# Patient Record
Sex: Female | Born: 1940 | Race: White | Smoking: Current every day smoker
Health system: Southern US, Community
[De-identification: ages and names within clinical notes are randomized; demographics above are authoritative.]

## PROBLEM LIST (undated history)

## (undated) DIAGNOSIS — G43909 Migraine, unspecified, not intractable, without status migrainosus: Secondary | ICD-10-CM

## (undated) HISTORY — PX: BACK SURGERY: SHX140

## (undated) HISTORY — DX: Migraine, unspecified, not intractable, without status migrainosus: G43.909

## (undated) HISTORY — PX: ABDOMINAL HYSTERECTOMY: SHX81

---

## 2012-05-30 HISTORY — PX: CATARACT EXTRACTION, BILATERAL: SHX1313

## 2013-09-11 DIAGNOSIS — E782 Mixed hyperlipidemia: Secondary | ICD-10-CM | POA: Diagnosis not present

## 2013-09-27 DIAGNOSIS — Z0000 Encounter for general adult medical examination without abnormal findings: Secondary | ICD-10-CM | POA: Diagnosis not present

## 2013-09-27 DIAGNOSIS — Z23 Encounter for immunization: Secondary | ICD-10-CM | POA: Diagnosis not present

## 2014-07-04 DIAGNOSIS — L309 Dermatitis, unspecified: Secondary | ICD-10-CM | POA: Diagnosis not present

## 2014-08-14 DIAGNOSIS — L259 Unspecified contact dermatitis, unspecified cause: Secondary | ICD-10-CM | POA: Diagnosis not present

## 2014-08-14 DIAGNOSIS — L309 Dermatitis, unspecified: Secondary | ICD-10-CM | POA: Diagnosis not present

## 2014-08-20 DIAGNOSIS — L309 Dermatitis, unspecified: Secondary | ICD-10-CM | POA: Diagnosis not present

## 2014-10-22 DIAGNOSIS — L309 Dermatitis, unspecified: Secondary | ICD-10-CM | POA: Diagnosis not present

## 2015-01-13 ENCOUNTER — Ambulatory Visit (INDEPENDENT_AMBULATORY_CARE_PROVIDER_SITE_OTHER): Payer: Medicare Other | Admitting: Primary Care

## 2015-01-13 ENCOUNTER — Encounter: Payer: Self-pay | Admitting: Primary Care

## 2015-01-13 VITALS — BP 116/62 | HR 97 | Temp 98.1°F | Ht 59.0 in | Wt 109.0 lb

## 2015-01-13 DIAGNOSIS — E785 Hyperlipidemia, unspecified: Secondary | ICD-10-CM | POA: Diagnosis not present

## 2015-01-13 NOTE — Assessment & Plan Note (Signed)
Present for years, FH of as well. Endorses healthy lifestyle with diet and exercise. Last lipid panel completed in 08/2013: TC: 241, HDL: 89, LDL: 138, Trigs: 72. Dose not currently take daily aspirin or medication. Start aspirin 81 mg daily today. Will recheck lipids at upcoming physical.

## 2015-01-13 NOTE — Progress Notes (Signed)
Pre visit review using our clinic review tool, if applicable. No additional management support is needed unless otherwise documented below in the visit note. 

## 2015-01-13 NOTE — Progress Notes (Signed)
Subjective:    Patient ID: Janet Burgess, female    DOB: [birth year], 74 y.o.   MRN: [id number]  HPI  Janet Burgess is a 74 year old female who presents today to establish care and discuss the problems mentioned below. Will obtain old records.  1) Migraines: Intermittently for the past 40 years. She will typically wake up with her migraines, and migraines typically come with alteration of her sleep patterns. Her migraines are present to bilateral temporal region and will be present with photophobia, phonophobia, and nausea. Last migraine was over 1 year ago.  2) Hyperlipidemia: Present for many years, familial hyperlipidemia as she endorses healthy lifestyle. Labs from last April 2015: Total cholesterol: 241, HDL: 89, LDL: 138, Trigs: 72. Does not currently take medication or daily aspirin.  Diet typically consists of: Breakfast: English muffin with peanut butter,  Lunch: Apple, sandwich, vegetables, chicken, fish Dinner: Vegetables, cereal and toast, breakfast foods.  Limited meat eater. Occasional desserts. Beverages: Drinks mostly water, crystal light, diet coke, juice  Exercise: She does not currently exercise, but is active in her daily life.  Review of Systems  Constitutional: Negative for unexpected weight change.  HENT: Negative for rhinorrhea.   Respiratory: Negative for cough and shortness of breath.   Cardiovascular: Negative for chest pain.  Gastrointestinal: Negative for diarrhea, constipation and blood in stool.  Genitourinary: Negative for difficulty urinating.  Musculoskeletal: Negative for myalgias and arthralgias.  Skin: Negative for rash.  Neurological: Negative for dizziness, numbness and headaches.       Motion sickness, occasionally  Psychiatric/Behavioral:       Denies concerns for anxiety or depression       Past Medical History  Diagnosis Date  . Migraine     Social History   Social History  . Marital Status: Widowed    Spouse Name: N/A  .  Number of Children: N/A  . Years of Education: N/A   Occupational History  . Not on file.   Social History Main Topics  . Smoking status: Current Every Day Smoker    Types: Cigarettes  . Smokeless tobacco: Not on file     Comment: 8 per day  . Alcohol Use: 0.0 oz/week    0 Standard drinks or equivalent per week     Comment: 2 to 3 a week  . Drug Use: Not on file  . Sexual Activity: Not on file   Other Topics Concern  . Not on file   Social History Narrative   Widowed.    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   Enjoys gardening, stained glass, wood working, spending time on her farm, traveling.       Past Surgical History  Procedure Laterality Date  . Abdominal hysterectomy  [phone]  . Back surgery  1982-1989    4 times  . Cataract extraction, bilateral Bilateral 2014    Family History  Problem Relation Age of Onset  . Cancer Mother     breast    No Known Allergies  No current outpatient prescriptions on file prior to visit.   No current facility-administered medications on file prior to visit.    BP 116/62 mmHg  Pulse 97  Temp(Src) 98.1 F (36.7 C) (Oral)  Ht 4\' 11"  (1.499 m)  Wt 109 lb (49.442 kg)  BMI 22.00 kg/m2  SpO2 95%    Objective:   Physical Exam  Constitutional: She appears well-nourished.  HENT:  Right Ear: Tympanic  membrane and ear canal normal.  Left Ear: Tympanic membrane and ear canal normal.  Cardiovascular: Normal rate and regular rhythm.   Pulmonary/Chest: Effort normal and breath sounds normal.  Skin: Skin is warm and dry.  Psychiatric: She has a normal mood and affect.          Assessment & Plan:

## 2015-01-13 NOTE — Patient Instructions (Signed)
Please schedule a physical with me in the next 3 months. You will also schedule a lab only appointment one week prior. We will discuss your lab results during your physical.  Start taking daily aspirin 81 mg.  It was a pleasure to meet you today! Please don't hesitate to call me with any questions. Welcome to [facility]!

## 2015-01-14 ENCOUNTER — Other Ambulatory Visit: Payer: Self-pay | Admitting: Primary Care

## 2015-01-14 DIAGNOSIS — Z1231 Encounter for screening mammogram for malignant neoplasm of breast: Secondary | ICD-10-CM

## 2015-02-16 ENCOUNTER — Ambulatory Visit: Payer: Federal, State, Local not specified - PPO

## 2015-03-03 ENCOUNTER — Ambulatory Visit (INDEPENDENT_AMBULATORY_CARE_PROVIDER_SITE_OTHER): Payer: Medicare Other | Admitting: Primary Care

## 2015-03-03 ENCOUNTER — Encounter: Payer: Self-pay | Admitting: Primary Care

## 2015-03-03 VITALS — BP 120/72 | HR 71 | Temp 98.0°F | Ht 59.0 in | Wt 108.1 lb

## 2015-03-03 DIAGNOSIS — Z23 Encounter for immunization: Secondary | ICD-10-CM | POA: Diagnosis not present

## 2015-03-03 DIAGNOSIS — L03116 Cellulitis of left lower limb: Secondary | ICD-10-CM | POA: Diagnosis not present

## 2015-03-03 MED ORDER — DOXYCYCLINE HYCLATE 100 MG PO TABS: 100.0000 mg | ORAL_TABLET | Freq: Two times a day (BID) | ORAL | Status: DC

## 2015-03-03 NOTE — Progress Notes (Signed)
   Subjective:    Patient ID: Janet Burgess, female    DOB: 12-04-1940, 74 y.o.   MRN: [id number]  HPI  Janet Burgess is a 74 year old female who presents today with a chief complaint of wound. Her wound has been present for the past 10 days and is located to the left medial portion of her lower extremity near her calf. She found a bamboo splinter in her skin that had been present for about 15 days. She was able to remove it several days later. Overall she's had little to no improvement to her wound and the pain has become worse and is now spreading up her leg. She's applied bacitracin, neosporin, warm compresses without improvement.   Review of Systems  Constitutional: Negative for fever and chills.  Respiratory: Negative for shortness of breath.   Cardiovascular: Negative for chest pain.  Skin: Positive for wound.  Neurological: Negative for dizziness.       Past Medical History  Diagnosis Date  . Migraine     Social History   Social History  . Marital Status: Widowed    Spouse Name: N/A  . Number of Children: N/A  . Years of Education: N/A   Occupational History  . Not on file.   Social History Main Topics  . Smoking status: Current Every Day Smoker    Types: Cigarettes  . Smokeless tobacco: Not on file     Comment: 8 per day  . Alcohol Use: 0.0 oz/week    0 Standard drinks or equivalent per week     Comment: 2 to 3 a week  . Drug Use: Not on file  . Sexual Activity: Not on file   Other Topics Concern  . Not on file   Social History Narrative   Widowed.    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   Enjoys gardening, stained glass, wood working, spending time on her farm, traveling.       Past Surgical History  Procedure Laterality Date  . Abdominal hysterectomy  [phone]  . Back surgery  1982-1989    4 times  . Cataract extraction, bilateral Bilateral 2014    Family History  Problem Relation Age of Onset  .  Cancer Mother     breast    No Known Allergies  No current outpatient prescriptions on file prior to visit.   No current facility-administered medications on file prior to visit.    BP 120/72 mmHg  Pulse 71  Temp(Src) 98 F (36.7 C) (Oral)  Ht 4\' 11"  (1.499 m)  Wt 108 lb 1.9 oz (49.043 kg)  BMI 21.83 kg/m2  SpO2 94%    Objective:   Physical Exam  Constitutional: She appears well-nourished.  Cardiovascular: Normal rate and regular rhythm.   Pulmonary/Chest: Effort normal and breath sounds normal.  Skin: Skin is warm.  1/2 cm raised wound with erythema surrounding. Appears infectious. No drainage.          Assessment & Plan:  Cellulitis:  Present to left lower extremity, medial side near calf. Found splinter in skin about 10 days ago. No improvement since removal despite OTC treatment. Appears infectious with erythema and tenderness. RX for Doxycycline BID x 10 days. Keep wound clean and dry. Follow up PRN.

## 2015-03-03 NOTE — Patient Instructions (Signed)
Start Doxycycline antibiotics for skin infection. Take 1 tablet by mouth twice daily for 10 days.   Continue to keep wound clean and dry.  Please notify me if no improvement in the next 5 days.  It was a pleasure to see you today!

## 2015-03-03 NOTE — Progress Notes (Signed)
Pre visit review using our clinic review tool, if applicable. No additional management support is needed unless otherwise documented below in the visit note. 

## 2015-04-19 ENCOUNTER — Other Ambulatory Visit: Payer: Self-pay | Admitting: Primary Care

## 2015-04-19 DIAGNOSIS — E785 Hyperlipidemia, unspecified: Secondary | ICD-10-CM

## 2015-04-27 ENCOUNTER — Other Ambulatory Visit (INDEPENDENT_AMBULATORY_CARE_PROVIDER_SITE_OTHER): Payer: Medicare Other

## 2015-04-27 DIAGNOSIS — E785 Hyperlipidemia, unspecified: Secondary | ICD-10-CM | POA: Diagnosis not present

## 2015-04-27 LAB — LIPID PANEL
CHOL/HDL RATIO: 3
Cholesterol: 210 mg/dL — ABNORMAL HIGH (ref 0–200)
HDL: 66.8 mg/dL (ref >39.00)
LDL Cholesterol: 124 mg/dL — ABNORMAL HIGH (ref 0–99)
NonHDL: 143
TRIGLYCERIDES: 97 mg/dL (ref 0.0–149.0)
VLDL: 19.4 mg/dL (ref 0.0–40.0)

## 2015-04-27 LAB — COMPREHENSIVE METABOLIC PANEL
ALBUMIN: 4.1 g/dL (ref 3.5–5.2)
ALK PHOS: 78 U/L (ref 39–117)
ALT: 17 U/L (ref 0–35)
AST: 21 U/L (ref 0–37)
BILIRUBIN TOTAL: 0.4 mg/dL (ref 0.2–1.2)
BUN: 14 mg/dL (ref 6–23)
CALCIUM: 9.6 mg/dL (ref 8.4–10.5)
CO2: 25 meq/L (ref 19–32)
CREATININE: 0.6 mg/dL (ref 0.40–1.20)
Chloride: 105 mEq/L (ref 96–112)
GFR: 104.38 mL/min (ref >60.00)
Glucose, Bld: 111 mg/dL — ABNORMAL HIGH (ref 70–99)
Potassium: 4.6 mEq/L (ref 3.5–5.1)
Sodium: 139 mEq/L (ref 135–145)
TOTAL PROTEIN: 6.8 g/dL (ref 6.0–8.3)

## 2015-05-01 ENCOUNTER — Encounter: Payer: Self-pay | Admitting: Primary Care

## 2015-05-01 ENCOUNTER — Ambulatory Visit (INDEPENDENT_AMBULATORY_CARE_PROVIDER_SITE_OTHER): Payer: Medicare Other | Admitting: Primary Care

## 2015-05-01 VITALS — BP 116/78 | HR 68 | Temp 97.3°F | Ht 59.0 in | Wt 112.4 lb

## 2015-05-01 DIAGNOSIS — R739 Hyperglycemia, unspecified: Secondary | ICD-10-CM

## 2015-05-01 DIAGNOSIS — Z0000 Encounter for general adult medical examination without abnormal findings: Secondary | ICD-10-CM | POA: Diagnosis not present

## 2015-05-01 DIAGNOSIS — E785 Hyperlipidemia, unspecified: Secondary | ICD-10-CM | POA: Diagnosis not present

## 2015-05-01 DIAGNOSIS — R7303 Prediabetes: Secondary | ICD-10-CM | POA: Insufficient documentation

## 2015-05-01 LAB — HEMOGLOBIN A1C: Hgb A1c MFr Bld: 5.9 % (ref 4.6–6.5)

## 2015-05-01 NOTE — Assessment & Plan Note (Signed)
Blood sugar of 111 fasting this year and 109 fasting in 2015. Will complete A1C as she follows a healthy lifestyle. Pending.

## 2015-05-01 NOTE — Patient Instructions (Signed)
Complete lab work prior to leaving today. I will notify you of your results.  Continue your healthy lifestyle. Increase walking to 5 days a week.  Ensure you are drinking enough water.  Follow up in 1 year for repeat physical.

## 2015-05-01 NOTE — Assessment & Plan Note (Signed)
Lipids improved. TC now 210, LDL WNL. Discussed importance of healthy diet and exercise. Will continue to monitor.

## 2015-05-01 NOTE — Progress Notes (Signed)
Pre visit review using our clinic review tool, if applicable. No additional management support is needed unless otherwise documented below in the visit note. 

## 2015-05-01 NOTE — Progress Notes (Signed)
Patient ID: Janet Burgess, female   DOB: Aug 18, 1940, 74 y.o.   MRN: [id number]  HPI: Ms. Dreesen is a 74 year old female who presents today for Annual Wellness Visit.  Past Medical History  Diagnosis Date  . Migraine     No current outpatient prescriptions on file.   No current facility-administered medications for this visit.    No Known Allergies  Family History  Problem Relation Age of Onset  . Cancer Mother     breast    Social History   Social History  . Marital Status: Widowed    Spouse Name: N/A  . Number of Children: N/A  . Years of Education: N/A   Occupational History  . Not on file.   Social History Main Topics  . Smoking status: Current Every Day Smoker    Types: Cigarettes  . Smokeless tobacco: Not on file     Comment: 8 per day  . Alcohol Use: 0.0 oz/week    0 Standard drinks or equivalent per week     Comment: 2 to 3 a week  . Drug Use: Not on file  . Sexual Activity: Not on file   Other Topics Concern  . Not on file   Social History Narrative   Widowed.    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   Enjoys gardening, stained glass, wood working, spending time on her farm, traveling.       Hospitiliaztions: None  Health Maintenance:    Flu: Completed in October 2016  Tetanus: Completed in 2014  Pneumovax: Completed 2013  Prevnar: Completed 2014  Zostavax: Completed in 2013  Bone Density: Completed in 2013  Colonoscopy: Never.   Eye Doctor: Completed in 2015 at [facility], Cataract Surgery  Dental Exam: Completed 1 week ago.  Mammogram: Working to schedule.      Providers: Alma Friendly, PCP, [facility]   I have personally reviewed and have noted: 1. The patient's medical and social history 2. Their use of alcohol, tobacco or illicit drugs 3. Their current medications and supplements 4. The patient's functional ability including ADL's, fall risks, home safety risks  and  hearing or visual impairment. 5. Diet and physical activities 6. Evidence for depression or mood disorder  Subjective:   Review of Systems:   Constitutional: Denies fever, malaise, fatigue, headache or abrupt weight changes.  HEENT: Denies eye pain, eye redness, ear pain, ringing in the ears, wax buildup, runny nose, nasal congestion, bloody nose, or sore throat. Respiratory: Denies difficulty breathing, shortness of breath, cough or sputum production.   Cardiovascular: Denies chest pain, chest tightness, palpitations or swelling in the hands or feet. History of aortic murmur, last echo 5 years ago, unchanged. No symptoms. Gastrointestinal: Denies abdominal pain, bloating, constipation, diarrhea or blood in the stool.  GU: Denies urgency, frequency, pain with urination, burning sensation, blood in urine, odor or discharge. Musculoskeletal: Denies decrease in range of motion, difficulty with gait, muscle pain or joint pain and swelling.  Skin: Denies redness, rashes, lesions or ulcercations.  Neurological: Denies dizziness, difficulty with memory, difficulty with speech or problems with balance and coordination.   No other specific complaints in a complete review of systems (except as listed in HPI above).  Objective:  PE:   BP 116/78 mmHg  Pulse 68  Temp(Src) 97.3 F (36.3 C) (Oral)  Ht 4\' 11"  (1.499 m)  Wt 112 lb 6.4 oz (50.984 kg)  BMI 22.69 kg/m2  SpO2 97% Wt Readings from Last 3 Encounters:  05/01/15 112 lb 6.4 oz (50.984 kg)  03/03/15 108 lb 1.9 oz (49.043 kg)  01/13/15 109 lb (49.442 kg)    General: Appears their stated age, well developed, well nourished in NAD. Skin: Warm, dry and intact. No rashes, lesions or ulcerations noted. HEENT: Head: normal shape and size; Eyes: sclera white, no icterus, conjunctiva pink, PERRLA and EOMs intact; Ears: Tm's gray and intact, normal light reflex; Nose: mucosa pink and moist, septum midline; Throat/Mouth: Teeth present, mucosa pink  and moist, no exudate, lesions or ulcerations noted.  Neck: Normal range of motion. Neck supple, trachea midline. No massses, lumps or thyromegaly present.  Cardiovascular: Normal rate and rhythm. S1,S2 noted.  Murmur noted to aortic side, rubs or gallops noted. No JVD or BLE edema. No carotid bruits noted. Pulmonary/Chest: Normal effort and positive vesicular breath sounds. No respiratory distress. No wheezes, rales or ronchi noted.  Abdomen: Soft and nontender. Normal bowel sounds, no bruits noted. No distention or masses noted. Liver, spleen and kidneys non palpable. Musculoskeletal: Normal range of motion. No signs of joint swelling. No difficulty with gait.  Neurological: Alert and oriented. Cranial nerves II-XII intact. Coordination normal. +DTRs bilaterally. Psychiatric: Mood and affect normal. Behavior is normal. Judgment and thought content normal.    BMET    Component Value Date/Time   NA 139 04/27/2015 0830   K 4.6 04/27/2015 0830   CL 105 04/27/2015 0830   CO2 25 04/27/2015 0830   GLUCOSE 111* 04/27/2015 0830   BUN 14 04/27/2015 0830   CREATININE 0.60 04/27/2015 0830   CALCIUM 9.6 04/27/2015 0830    Lipid Panel     Component Value Date/Time   CHOL 210* 04/27/2015 0830   TRIG 97.0 04/27/2015 0830   HDL 66.80 04/27/2015 0830   CHOLHDL 3 04/27/2015 0830   VLDL 19.4 04/27/2015 0830   LDLCALC 124* 04/27/2015 0830    CBC No results found for: WBC, RBC, HGB, HCT, PLT, MCV, MCH, MCHC, RDW, LYMPHSABS, MONOABS, EOSABS, BASOSABS  Hgb A1C No results found for: HGBA1C    Assessment and Plan:   Medicare Annual Wellness Visit:  Diet: Endorses a healthy diet. Breakfast: Muffin Lunch: Salad, sandwich, fruit Dinner: Soup and sandwich, salad, breakfast food Snack: None Desserts: Fruit, candy bar Beverages: Water, lemonade, diet soda Physical activity: Active at home. Walks 2 miles some days of the week. Depression/mood screen: Negative Hearing: Intact to whispered  voice Visual acuity: Grossly normal, performs annual eye exam  ADLs: Capable Fall risk: None Home safety: Good Cognitive evaluation: Intact to orientation, naming, recall and repetition EOL planning: Adv directives, has and will bring copy.  Preventative Medicine: Discussed increasing exercise. Vaccinations UTD. Handout provided for Mammogram scheduling. Diet recommendations provided.   Next appointment: Follow up in 1 year for repeat physical.

## 2015-05-01 NOTE — Assessment & Plan Note (Signed)
Vaccinations UTD. Patient to schedule mammogram. Discussed importance of healthy diet and exercise. Will defer bone density testing until next year. Lipids improved; noted hyperglycemia, will do A1C. Exam unremarkable.   I have personally reviewed and have noted: 1. The patient's medical and social history 2. Their use of alcohol, tobacco or illicit drugs 3. Their current medications and supplements 4. The patient's functional ability including ADL's, fall risks,  home safety risks and hearing or visual impairment. 5. Diet and physical activities 6. Evidence for depression or mood disorder  Follow up in 1 year

## 2015-06-24 ENCOUNTER — Other Ambulatory Visit: Payer: Self-pay | Admitting: Primary Care

## 2015-06-24 ENCOUNTER — Ambulatory Visit
Admission: RE | Admit: 2015-06-24 | Discharge: 2015-06-24 | Disposition: A | Discharge status: Home / Self Care | Payer: Medicare Other | Source: Ambulatory Visit | Attending: Primary Care | Admitting: Primary Care

## 2015-06-24 DIAGNOSIS — Z1231 Encounter for screening mammogram for malignant neoplasm of breast: Secondary | ICD-10-CM | POA: Diagnosis present

## 2015-07-10 DIAGNOSIS — H578 Other specified disorders of eye and adnexa: Secondary | ICD-10-CM | POA: Diagnosis not present

## 2015-07-20 ENCOUNTER — Other Ambulatory Visit: Payer: Self-pay | Admitting: Primary Care

## 2015-07-20 ENCOUNTER — Other Ambulatory Visit (INDEPENDENT_AMBULATORY_CARE_PROVIDER_SITE_OTHER): Payer: Medicare Other

## 2015-07-20 DIAGNOSIS — R739 Hyperglycemia, unspecified: Secondary | ICD-10-CM | POA: Diagnosis not present

## 2015-07-20 DIAGNOSIS — R7303 Prediabetes: Secondary | ICD-10-CM

## 2015-07-20 LAB — HEMOGLOBIN A1C: Hgb A1c MFr Bld: 5.9 % (ref 4.6–6.5)

## 2015-07-29 ENCOUNTER — Other Ambulatory Visit: Payer: Medicare Other

## 2015-08-05 ENCOUNTER — Other Ambulatory Visit: Payer: Medicare Other

## 2015-09-08 ENCOUNTER — Telehealth: Payer: Self-pay | Admitting: Primary Care

## 2015-09-08 NOTE — Telephone Encounter (Signed)
Called and notified patient of Kate's comments. Patient stated that she already tried meclizine and still having vertigo.

## 2015-09-08 NOTE — Telephone Encounter (Signed)
[facility] scheduled appt on 09/09/15 at 8 AM with Allie Bossier NP.

## 2015-09-08 NOTE — Telephone Encounter (Signed)
Noted. Will see her tomorrow as scheduled.

## 2015-09-08 NOTE — Telephone Encounter (Signed)
Noted. She could also try Meclizine. This is used to treat vertigo symptoms and may be purchased over the counter.

## 2015-09-08 NOTE — Telephone Encounter (Signed)
PLEASE NOTE: All timestamps contained within this report are represented as [place] Standard Time. CONFIDENTIALTY NOTICE: This fax transmission is intended only for the addressee. It contains information that is legally privileged, confidential or otherwise protected from use or disclosure. If you are not the intended recipient, you are strictly prohibited from reviewing, disclosing, copying using or disseminating any of this information or taking any action in reliance on or regarding this information. If you have received this fax in error, please notify us immediately by telephone so that we can arrange for its return to [place]. Phone: [phone], Toll-Free: [phone], Fax: [phone] Page: 1 of 1 Call Id: [id number] [place] Patient Name: Janet Burgess DOB: 26-Jan-1941 Initial Comment Caller states she's on day six of Vertigo, she's on best rest. Nurse Assessment Nurse: Marcelline Deist, RN, Lynda Date/Time (Eastern Time): 09/08/2015 8:39:24 AM Confirm and document reason for call. If symptomatic, describe symptoms. You must click the next button to save text entered. ---Caller states she's on day six of vertigo, she's on best rest. She has tried OTC Dramamine, no relief. Has had this in the past over the last 10 years. Has had nausea, too. Had this about 3+ years ago, had a Medrol dose pack. No fever, headache , or ringing of ears. Has the patient traveled out of the country within the last 30 days? ---Not Applicable Does the patient have any new or worsening symptoms? ---Yes Will a triage be completed? ---Yes Related visit to physician within the last 2 weeks? ---No Does the PT have any chronic conditions? (i.e. diabetes, asthma, etc.) ---No Is this a behavioral health or substance abuse call? ---No Guidelines Guideline Title Affirmed Question Affirmed Notes Dizziness - Vertigo [1] Dizziness  (vertigo) present now AND [2] age > 27 (Exception: prior physician evaluation for this AND no different/worse than usual) Final Disposition User Go to ED Now (or PCP triage) Marcelline Deist, RN, Lynda Comments Caller states she has been given a gamut of tests in the past for this, as well as a Medrol pack. Will use the [facility] on [facility]. if a rx is called in. NKDA. Would rather not be seen at UC/[facility], but scheduled with her provider tomorrow am. Disagree/Comply: Comply

## 2015-09-09 ENCOUNTER — Ambulatory Visit (INDEPENDENT_AMBULATORY_CARE_PROVIDER_SITE_OTHER): Payer: Medicare Other | Admitting: Primary Care

## 2015-09-09 ENCOUNTER — Encounter: Payer: Self-pay | Admitting: Primary Care

## 2015-09-09 VITALS — BP 136/76 | HR 64 | Temp 98.0°F | Ht 59.0 in | Wt 107.0 lb

## 2015-09-09 DIAGNOSIS — R42 Dizziness and giddiness: Secondary | ICD-10-CM | POA: Diagnosis not present

## 2015-09-09 DIAGNOSIS — R110 Nausea: Secondary | ICD-10-CM | POA: Diagnosis not present

## 2015-09-09 MED ORDER — METOCLOPRAMIDE HCL 10 MG PO TABS: 10.0000 mg | ORAL_TABLET | Freq: Three times a day (TID) | ORAL | Status: DC | PRN

## 2015-09-09 NOTE — Patient Instructions (Signed)
You may take the Metoclopramide 10 mg every 8 hours as needed for nausea and dizziness.   Start Zyrtec at bedtime every day for the next 4 weeks.  Ensure you are staying hydrated with water.   Please notify me if no improvement in symptoms in 3-4 days.  It was a pleasure to see you today!

## 2015-09-09 NOTE — Progress Notes (Signed)
Pre visit review using our clinic review tool, if applicable. No additional management support is needed unless otherwise documented below in the visit note. 

## 2015-09-09 NOTE — Progress Notes (Signed)
Subjective:    Patient ID: Janet Burgess, female    DOB: Aug 16, 1940, 75 y.o.   MRN: [id number]  HPI  [name] is a 75 year old female who presents today with a chief complaint of dizziness. Her dizziness has been present for the past 7 days. Today she's feeling slightly improved.   She's encountered vertigo in the past, especially during seasonal changes from Winter to Spring. She's felt nauseated for the past 6 days, which is slightly improved today. She has spent most of her time in her bed over the past several days as she felt as though her surroundings were moving and could not stand to move much. Today she was able to drive to her appointment.   She's tried taking Dramamine, Meclizine, and has increased consumption of water without improvement. Denies cough, fevers, ear fullness, sneezing, chest pain.  Review of Systems  Constitutional: Negative for fever and chills.  HENT: Negative for ear pain.   Respiratory: Negative for cough.   Cardiovascular: Negative for chest pain.  Neurological: Positive for dizziness. Negative for numbness.       Past Medical History  Diagnosis Date  . Migraine     Social History   Social History  . Marital Status: Widowed    Spouse Name: N/A  . Number of Children: N/A  . Years of Education: N/A   Occupational History  . Not on file.   Social History Main Topics  . Smoking status: Current Every Day Smoker    Types: Cigarettes  . Smokeless tobacco: Not on file     Comment: 8 per day  . Alcohol Use: 0.0 oz/week    0 Standard drinks or equivalent per week     Comment: 2 to 3 a week  . Drug Use: Not on file  . Sexual Activity: Not on file   Other Topics Concern  . Not on file   Social History Narrative   Widowed.    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   Enjoys gardening, stained glass, wood working, spending time on her farm, traveling.       Past Surgical History  Procedure  Laterality Date  . Abdominal hysterectomy  [phone]  . Back surgery  1982-1989    4 times  . Cataract extraction, bilateral Bilateral 2014    Family History  Problem Relation Age of Onset  . Breast cancer Mother 107    No Known Allergies  No current outpatient prescriptions on file prior to visit.   No current facility-administered medications on file prior to visit.    BP 136/76 mmHg  Pulse 64  Temp(Src) 98 F (36.7 C) (Oral)  Ht 4\' 11"  (1.499 m)  Wt 107 lb (48.535 kg)  BMI 21.60 kg/m2  SpO2 94%    Objective:   Physical Exam  Constitutional: She appears well-nourished.  Eyes: EOM are normal. Pupils are equal, round, and reactive to light.  Cardiovascular: Normal rate and regular rhythm.   Pulmonary/Chest: Effort normal and breath sounds normal.  Neurological: No cranial nerve deficit. Coordination normal.  Skin: Skin is warm and dry.          Assessment & Plan:  Vertigo:   History of in the past with seasonal changes. Recent episode present for the past 6 days, no improvement with dramamine or meclizine. Feeling slightly improved today as she was able to drive to her appointment today.. Exam today unremarkable. Negative orthostatic vitals which is  reassuring. Will treat nausea and vertigo with metoclopramide PRN. Also start Zyrtec HS. Continue to stay hydrated.  Follow up PRN. She is to update me if no improvement. Will consider sending for vestibular rehabilitation if no improvement.

## 2015-10-27 DIAGNOSIS — Z961 Presence of intraocular lens: Secondary | ICD-10-CM | POA: Diagnosis not present

## 2015-10-28 ENCOUNTER — Other Ambulatory Visit: Payer: Self-pay | Admitting: Primary Care

## 2015-10-28 DIAGNOSIS — E785 Hyperlipidemia, unspecified: Secondary | ICD-10-CM

## 2015-10-28 DIAGNOSIS — R7303 Prediabetes: Secondary | ICD-10-CM

## 2015-10-30 ENCOUNTER — Other Ambulatory Visit (INDEPENDENT_AMBULATORY_CARE_PROVIDER_SITE_OTHER): Payer: Medicare Other

## 2015-10-30 DIAGNOSIS — R7303 Prediabetes: Secondary | ICD-10-CM

## 2015-10-30 DIAGNOSIS — E785 Hyperlipidemia, unspecified: Secondary | ICD-10-CM | POA: Diagnosis not present

## 2015-10-30 LAB — LIPID PANEL
CHOL/HDL RATIO: 3
Cholesterol: 214 mg/dL — ABNORMAL HIGH (ref 0–200)
HDL: 68.4 mg/dL (ref >39.00)
LDL Cholesterol: 129 mg/dL — ABNORMAL HIGH (ref 0–99)
NONHDL: 145.42
Triglycerides: 81 mg/dL (ref 0.0–149.0)
VLDL: 16.2 mg/dL (ref 0.0–40.0)

## 2015-10-30 LAB — HEMOGLOBIN A1C: Hgb A1c MFr Bld: 5.9 % (ref 4.6–6.5)

## 2016-04-25 ENCOUNTER — Encounter: Payer: Self-pay | Admitting: Primary Care

## 2016-05-16 DIAGNOSIS — M65342 Trigger finger, left ring finger: Secondary | ICD-10-CM | POA: Diagnosis not present

## 2016-05-16 DIAGNOSIS — G5602 Carpal tunnel syndrome, left upper limb: Secondary | ICD-10-CM | POA: Diagnosis not present

## 2016-05-25 ENCOUNTER — Other Ambulatory Visit (INDEPENDENT_AMBULATORY_CARE_PROVIDER_SITE_OTHER): Payer: Medicare Other

## 2016-05-25 ENCOUNTER — Other Ambulatory Visit: Payer: Self-pay | Admitting: Family Medicine

## 2016-05-25 DIAGNOSIS — R739 Hyperglycemia, unspecified: Secondary | ICD-10-CM | POA: Diagnosis not present

## 2016-05-25 DIAGNOSIS — E785 Hyperlipidemia, unspecified: Secondary | ICD-10-CM

## 2016-05-25 DIAGNOSIS — R42 Dizziness and giddiness: Secondary | ICD-10-CM | POA: Diagnosis not present

## 2016-05-25 LAB — TSH: TSH: 2.76 u[IU]/mL (ref 0.35–4.50)

## 2016-05-25 LAB — CBC WITH DIFFERENTIAL/PLATELET
Basophils Absolute: 0 K/uL (ref 0.0–0.1)
Basophils Relative: 0.5 % (ref 0.0–3.0)
Eosinophils Absolute: 0.1 K/uL (ref 0.0–0.7)
Eosinophils Relative: 1.7 % (ref 0.0–5.0)
HCT: 43.6 % (ref 36.0–46.0)
Hemoglobin: 14.9 g/dL (ref 12.0–15.0)
Lymphocytes Relative: 34.3 % (ref 12.0–46.0)
Lymphs Abs: 2.7 K/uL (ref 0.7–4.0)
MCHC: 34.1 g/dL (ref 30.0–36.0)
MCV: 96.2 fl (ref 78.0–100.0)
Monocytes Absolute: 0.7 K/uL (ref 0.1–1.0)
Monocytes Relative: 8.4 % (ref 3.0–12.0)
Neutro Abs: 4.4 K/uL (ref 1.4–7.7)
Neutrophils Relative %: 55.1 % (ref 43.0–77.0)
Platelets: 249 K/uL (ref 150.0–400.0)
RBC: 4.53 Mil/uL (ref 3.87–5.11)
RDW: 14.6 % (ref 11.5–15.5)
WBC: 7.9 K/uL (ref 4.0–10.5)

## 2016-05-25 LAB — BASIC METABOLIC PANEL WITH GFR
BUN: 24 mg/dL — ABNORMAL HIGH (ref 6–23)
CO2: 28 meq/L (ref 19–32)
Calcium: 9.8 mg/dL (ref 8.4–10.5)
Chloride: 102 meq/L (ref 96–112)
Creatinine, Ser: 0.82 mg/dL (ref 0.40–1.20)
GFR: 72.57 mL/min
Glucose, Bld: 103 mg/dL — ABNORMAL HIGH (ref 70–99)
Potassium: 4.1 meq/L (ref 3.5–5.1)
Sodium: 138 meq/L (ref 135–145)

## 2016-05-25 LAB — LIPID PANEL
CHOL/HDL RATIO: 3
CHOLESTEROL: 242 mg/dL — AB (ref 0–200)
HDL: 81.6 mg/dL (ref >39.00)
LDL CALC: 141 mg/dL — AB (ref 0–99)
NonHDL: 160.44
Triglycerides: 98 mg/dL (ref 0.0–149.0)
VLDL: 19.6 mg/dL (ref 0.0–40.0)

## 2016-05-25 LAB — HEMOGLOBIN A1C: Hgb A1c MFr Bld: 5.9 % (ref 4.6–6.5)

## 2016-06-03 ENCOUNTER — Ambulatory Visit (INDEPENDENT_AMBULATORY_CARE_PROVIDER_SITE_OTHER): Payer: Medicare Other | Admitting: Primary Care

## 2016-06-03 ENCOUNTER — Encounter: Payer: Self-pay | Admitting: Primary Care

## 2016-06-03 VITALS — BP 116/74 | HR 86 | Temp 98.1°F | Ht 59.0 in | Wt 108.4 lb

## 2016-06-03 DIAGNOSIS — E2839 Other primary ovarian failure: Secondary | ICD-10-CM

## 2016-06-03 DIAGNOSIS — E785 Hyperlipidemia, unspecified: Secondary | ICD-10-CM | POA: Diagnosis not present

## 2016-06-03 DIAGNOSIS — Z1231 Encounter for screening mammogram for malignant neoplasm of breast: Secondary | ICD-10-CM | POA: Diagnosis not present

## 2016-06-03 DIAGNOSIS — Z1239 Encounter for other screening for malignant neoplasm of breast: Secondary | ICD-10-CM

## 2016-06-03 DIAGNOSIS — Z0000 Encounter for general adult medical examination without abnormal findings: Secondary | ICD-10-CM

## 2016-06-03 DIAGNOSIS — R7303 Prediabetes: Secondary | ICD-10-CM

## 2016-06-03 NOTE — Assessment & Plan Note (Signed)
Above goal, discussed to work on exercise. Repeat in 6 months. She is taking a daily aspirin.

## 2016-06-03 NOTE — Progress Notes (Signed)
Patient ID: Janet Burgess, female   DOB: [birth year], 76 y.o.   MRN: [id number]  HPI: Janet Burgess is a 76 year old female who presents today for her annual wellness visit.  Past Medical History:  Diagnosis Date  . Migraine     No current outpatient prescriptions on file.   No current facility-administered medications for this visit.     No Known Allergies  Family History  Problem Relation Age of Onset  . Breast cancer Mother 3    Social History   Social History  . Marital status: Widowed    Spouse name: N/A  . Number of children: N/A  . Years of education: N/A   Occupational History  . Not on file.   Social History Main Topics  . Smoking status: Current Every Day Smoker    Types: Cigarettes  . Smokeless tobacco: Not on file     Comment: 8 per day  . Alcohol use 0.0 oz/week     Comment: 2 to 3 a week  . Drug use: Unknown  . Sexual activity: Not on file   Other Topics Concern  . Not on file   Social History Narrative   Widowed.    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   Enjoys gardening, stained glass, wood working, spending time on her farm, traveling.       Hospitiliaztions: None  Health Maintenance:     Flu: Completed in November 2017  Tetanus: Completed in 2014  Pneumovax: Completed in 2013  Prevnar: Completed in 2014  Zostavax: Completed in 2013  Bone Density: Due  Colonoscopy: Declines  Eye Doctor: Completed in 2017.  Dental Exam: Due next week.  Mammogram: Completed in February 2017, due in February 2018  Pap: Hysterectomy    Providers: Dr. Rudene Christians, Hand Surgeon; Alma Friendly, PCP   I have personally reviewed and have noted: 1. The patient's medical and social history 2. Their use of alcohol, tobacco or illicit drugs 3. Their current medications and supplements 4. The patient's functional ability including ADL's, fall risks, home safety risks  and hearing or visual impairment. 5. Diet and  physical activities 6. Evidence for depression or mood disorder  Subjective:   Review of Systems:   Constitutional: Denies fever, malaise, fatigue, headache or abrupt weight changes.  HEENT: Denies eye pain, eye redness, ear pain, ringing in the ears, wax buildup, runny nose, nasal congestion, bloody nose, or sore throat. Respiratory: Denies difficulty breathing, shortness of breath, cough or sputum production.   Cardiovascular: Denies chest pain, chest tightness, palpitations or swelling in the hands or feet.  Gastrointestinal: Denies abdominal pain, bloating, constipation, diarrhea or blood in the stool.  GU: Denies urgency, frequency, pain with urination, burning sensation, blood in urine, odor or discharge. Musculoskeletal: Denies decrease in range of motion, difficulty with gait, muscle pain or joint pain and swelling. she has experienced carpal tunnel symptoms and is seeing a Psychologist, sport and exercise. Skin: Denies redness, rashes, lesions or ulcercations.  Neurological: Denies dizziness, difficulty with memory, difficulty with speech or problems with balance and coordination.   No other specific complaints in a complete review of systems (except as listed in HPI above).  Objective:  PE:   BP 116/74   Pulse 86   Temp 98.1 F (36.7 C) (Oral)   Ht 4\' 11"  (1.499 m)   Wt 108 lb 6.4 oz (49.2 kg)   SpO2 98%   BMI 21.89 kg/m  Wt Readings from Last 3  Encounters:  06/03/16 108 lb 6.4 oz (49.2 kg)  09/09/15 107 lb (48.5 kg)  05/01/15 112 lb 6.4 oz (51 kg)    General: Appears their stated age, well developed, well nourished in NAD. Skin: Warm, dry and intact. No rashes, lesions or ulcerations noted. HEENT: Head: normal shape and size; Eyes: sclera white, no icterus, conjunctiva pink, PERRLA and EOMs intact; Ears: Tm's gray and intact, normal light reflex; Nose: mucosa pink and moist, septum midline; Throat/Mouth: Teeth present, mucosa pink and moist, no exudate, lesions or ulcerations noted.  Neck:  Normal range of motion. Neck supple, trachea midline. No massses, lumps or thyromegaly present.  Cardiovascular: Normal rate and rhythm. S1,S2 noted.  No murmur, rubs or gallops noted. No JVD or BLE edema. No carotid bruits noted. Pulmonary/Chest: Normal effort and positive vesicular breath sounds. No respiratory distress. No wheezes, rales or ronchi noted.  Abdomen: Soft and nontender. Normal bowel sounds, no bruits noted. No distention or masses noted. Liver, spleen and kidneys non palpable. Musculoskeletal: Normal range of motion. No signs of joint swelling. No difficulty with gait.  Neurological: Alert and oriented. Cranial nerves II-XII intact. Coordination normal. +DTRs bilaterally. Psychiatric: Mood and affect normal. Behavior is normal. Judgment and thought content normal.     BMET    Component Value Date/Time   NA 138 05/25/2016 0826   K 4.1 05/25/2016 0826   CL 102 05/25/2016 0826   CO2 28 05/25/2016 0826   GLUCOSE 103 (H) 05/25/2016 0826   BUN 24 (H) 05/25/2016 0826   CREATININE 0.82 05/25/2016 0826   CALCIUM 9.8 05/25/2016 0826    Lipid Panel     Component Value Date/Time   CHOL 242 (H) 05/25/2016 0826   TRIG 98.0 05/25/2016 0826   HDL 81.60 05/25/2016 0826   CHOLHDL 3 05/25/2016 0826   VLDL 19.6 05/25/2016 0826   LDLCALC 141 (H) 05/25/2016 0826    CBC    Component Value Date/Time   WBC 7.9 05/25/2016 0826   RBC 4.53 05/25/2016 0826   HGB 14.9 05/25/2016 0826   HCT 43.6 05/25/2016 0826   PLT 249.0 05/25/2016 0826   MCV 96.2 05/25/2016 0826   MCHC 34.1 05/25/2016 0826   RDW 14.6 05/25/2016 0826   LYMPHSABS 2.7 05/25/2016 0826   MONOABS 0.7 05/25/2016 0826   EOSABS 0.1 05/25/2016 0826   BASOSABS 0.0 05/25/2016 0826    Hgb A1C Lab Results  Component Value Date   HGBA1C 5.9 05/25/2016      Assessment and Plan:   Medicare Annual Wellness Visit:  Diet: She endorses a healthy diet. Breakfast: Egg bowel Lunch: Skips Dinner: Vegetables, meat, some  starches. Snacks: Nuts Desserts: Occasionally Beverages: Water, crystal light, diet coke, coffee Physical activity: Active, does lift weights and walks sometimes. Depression/mood screen: Negative Hearing: Intact to whispered voice Visual acuity: Grossly normal, performs annual eye exam  ADLs: Capable Fall risk: None Home safety: Good Cognitive evaluation: Intact to orientation, naming, recall and repetition EOL planning: Adv directives, full code  Preventative Medicine: Immunizations UTD. Bone density and mammogram due in February, orders placed. Declines colonoscopy, declines Cologuard, but will think about it. Overall healthy diet. Encouraged regular exercise. Exam unremarkable, labs with hyperlipidemia and prediabetes (will follow). All recommendations provided to patient.  Next appointment: Repeat labs in 6 months, follow up in 1 year.

## 2016-06-03 NOTE — Assessment & Plan Note (Signed)
Repeat A1C of 5.9, stable. Reduce complex carbohydrates, exercise. Repeat in 6 months.

## 2016-06-03 NOTE — Assessment & Plan Note (Signed)
Immunizations UTD. Bone density and mammogram due in February, orders placed. Declines colonoscopy, declines Cologuard, but will think about it. Overall healthy diet. Encouraged regular exercise. Exam unremarkable, labs with hyperlipidemia and prediabetes (will follow). All recommendations provided to patient.  I have personally reviewed and have noted: 1. The patient's medical and social history 2. Their use of alcohol, tobacco or illicit drugs 3. Their current medications and supplements 4. The patient's functional ability including ADL's, fall  risks, home safety risks and hearing or visual  impairment. 5. Diet and physical activities 6. Evidence for depression or mood disorder

## 2016-06-03 NOTE — Patient Instructions (Signed)
Continue your efforts towards a health lifestyle through diet and exercise.  Continue exercising. You should be getting 150 minutes of moderate intensity exercise weekly.  Ensure you are consuming 64 ounces of water daily.  Schedule a lab only appointment in 6 months to recheck your cholesterol and blood sugar.  Follow up in 1 year for your annual exam.  It was a pleasure to see you today!

## 2016-06-03 NOTE — Progress Notes (Signed)
Pre visit review using our clinic review tool, if applicable. No additional management support is needed unless otherwise documented below in the visit note. 

## 2016-07-06 DIAGNOSIS — G5602 Carpal tunnel syndrome, left upper limb: Secondary | ICD-10-CM | POA: Diagnosis not present

## 2016-07-06 DIAGNOSIS — M65342 Trigger finger, left ring finger: Secondary | ICD-10-CM | POA: Diagnosis not present

## 2016-07-25 ENCOUNTER — Other Ambulatory Visit: Payer: Medicare Other

## 2016-07-25 ENCOUNTER — Ambulatory Visit: Payer: Medicare Other

## 2016-08-05 DIAGNOSIS — D485 Neoplasm of uncertain behavior of skin: Secondary | ICD-10-CM | POA: Diagnosis not present

## 2016-08-05 DIAGNOSIS — D0439 Carcinoma in situ of skin of other parts of face: Secondary | ICD-10-CM | POA: Diagnosis not present

## 2016-08-05 DIAGNOSIS — L578 Other skin changes due to chronic exposure to nonionizing radiation: Secondary | ICD-10-CM | POA: Diagnosis not present

## 2016-08-05 DIAGNOSIS — D0339 Melanoma in situ of other parts of face: Secondary | ICD-10-CM | POA: Diagnosis not present

## 2016-08-18 ENCOUNTER — Ambulatory Visit
Admission: RE | Admit: 2016-08-18 | Discharge: 2016-08-18 | Disposition: A | Discharge status: Home / Self Care | Payer: Medicare Other | Source: Ambulatory Visit | Attending: Primary Care | Admitting: Primary Care

## 2016-08-18 DIAGNOSIS — M810 Age-related osteoporosis without current pathological fracture: Secondary | ICD-10-CM | POA: Diagnosis not present

## 2016-08-18 DIAGNOSIS — E2839 Other primary ovarian failure: Secondary | ICD-10-CM | POA: Diagnosis not present

## 2016-08-18 DIAGNOSIS — Z1231 Encounter for screening mammogram for malignant neoplasm of breast: Secondary | ICD-10-CM | POA: Insufficient documentation

## 2016-08-18 DIAGNOSIS — Z1239 Encounter for other screening for malignant neoplasm of breast: Secondary | ICD-10-CM

## 2016-08-24 ENCOUNTER — Ambulatory Visit (INDEPENDENT_AMBULATORY_CARE_PROVIDER_SITE_OTHER): Payer: Medicare Other | Admitting: Primary Care

## 2016-08-24 ENCOUNTER — Ambulatory Visit: Payer: Medicare Other | Admitting: Primary Care

## 2016-08-24 VITALS — BP 118/74 | HR 74 | Temp 97.8°F | Ht 59.0 in | Wt 110.1 lb

## 2016-08-24 DIAGNOSIS — T148XXA Other injury of unspecified body region, initial encounter: Secondary | ICD-10-CM | POA: Diagnosis not present

## 2016-08-24 DIAGNOSIS — Z85820 Personal history of malignant melanoma of skin: Secondary | ICD-10-CM | POA: Diagnosis not present

## 2016-08-24 DIAGNOSIS — M816 Localized osteoporosis [Lequesne]: Secondary | ICD-10-CM

## 2016-08-24 DIAGNOSIS — M810 Age-related osteoporosis without current pathological fracture: Secondary | ICD-10-CM | POA: Insufficient documentation

## 2016-08-24 MED ORDER — MUPIROCIN CALCIUM 2 % EX CREA: 1.0000 "application " | TOPICAL_CREAM | Freq: Two times a day (BID) | CUTANEOUS | 1 refills | Status: DC

## 2016-08-24 NOTE — Progress Notes (Signed)
Pre visit review using our clinic review tool, if applicable. No additional management support is needed unless otherwise documented below in the visit note. 

## 2016-08-24 NOTE — Assessment & Plan Note (Signed)
To femoral neck, osteopenia to forearm. Will have her start with calcium/vitamin D. Refusing bisphosphonate treatment at this time. Repeat bone density scan in 2 years.

## 2016-08-24 NOTE — Progress Notes (Signed)
Subjective:    Patient ID: Janet Burgess, female    DOB: [birth year], 76 y.o.   MRN: [id number]  HPI  Janet Burgess is a 76 year old female who presents today to discuss several matters.   1) Osteoporosis: Bone density scan completed in late March 2018 with results of osteoporosis to her femur neck on the right side, osteopenia to the left radius of her forearm. She is not currently taking calcium with vitamin D.  She is not wanting treatment with bisphosphonates as she's read a lot of potential side effects.   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 She would like a refill of her Mupirocin cream.   Currently following with dermatology, recently had shave with biopsies completed for two facial lesions. 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 She is scheduled to have Mohs procedures in April 2018. She is requesting a new referral to another dermatologist as she would like a second opinion. She has several other places on her body that are of concern.  Review of Systems  Constitutional: Negative for unexpected weight change.  Skin: Negative for wound.       Skin discoloration  Neurological: Negative for weakness.       Past Medical History:  Diagnosis Date  . Migraine      Social History   Social History  . Marital status: Widowed    Spouse name: N/A  . Number of children: N/A  . Years of education: N/A   Occupational History  . Not on file.   Social History Main Topics  . Smoking status: Current Every Day Smoker    Types: Cigarettes  . Smokeless tobacco: Not on file     Comment: 8 per day  . Alcohol use 0.0 oz/week     Comment: 2 to 3 a week  . Drug use: Unknown  . Sexual activity: Not on file   Other Topics Concern  . Not on file   Social History Narrative   Widowed.    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   Enjoys gardening, stained glass, wood working, spending time on her farm, traveling.       Past Surgical History:  Procedure Laterality Date  . ABDOMINAL HYSTERECTOMY  1974-1975  . BACK SURGERY  [phone]   4 times  . CATARACT EXTRACTION, BILATERAL Bilateral 2014    Family History  Problem Relation Age of Onset  . Breast cancer Mother 36    No Known Allergies  Current Outpatient Prescriptions on File Prior to Visit  Medication Sig Dispense Refill  . aspirin EC 81 MG tablet Take 81 mg by mouth daily.     No current facility-administered medications on file prior to visit.     BP 118/74   Pulse 74   Temp 97.8 F (36.6 C) (Oral)   Ht 4\' 11"  (1.499 m)   Wt 110 lb 1.9 oz (50 kg)   SpO2 99%   BMI 22.24 kg/m    Objective:   Physical Exam  Constitutional: She appears well-nourished.  Neck: Neck supple.  Cardiovascular: Normal rate and regular rhythm.   Murmur heard. Pulmonary/Chest: Effort normal and breath sounds normal.  Skin: Skin is warm and dry.  Several darkened spots to face. Sites of biopsies appear well. No s/s of infection.  Assessment & Plan:

## 2016-08-24 NOTE — Patient Instructions (Signed)
Stop by the front desk and speak with either Rosaria Ferries or Shirlean Mylar regarding your referral to Dermatology.  Start calcium with vitamin D for osteoporosis. Take 1200 mg of calcium with 800 mg of vitamin D. These are often taken in divided doses. Any brand of medication will work. We will repeat your bone density scan in 2 years.  It was a pleasure to see you today!

## 2016-08-24 NOTE — Assessment & Plan Note (Signed)
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 Referral placed to dermatology for second opinion as requested.

## 2016-09-06 DIAGNOSIS — D0339 Melanoma in situ of other parts of face: Secondary | ICD-10-CM | POA: Diagnosis not present

## 2016-09-06 DIAGNOSIS — D485 Neoplasm of uncertain behavior of skin: Secondary | ICD-10-CM | POA: Diagnosis not present

## 2016-09-06 DIAGNOSIS — L814 Other melanin hyperpigmentation: Secondary | ICD-10-CM | POA: Diagnosis not present

## 2016-09-20 ENCOUNTER — Ambulatory Visit (INDEPENDENT_AMBULATORY_CARE_PROVIDER_SITE_OTHER): Payer: Medicare Other | Admitting: Primary Care

## 2016-09-20 ENCOUNTER — Encounter: Payer: Self-pay | Admitting: Primary Care

## 2016-09-20 VITALS — BP 120/82 | HR 75 | Temp 98.2°F | Ht 59.0 in | Wt 110.1 lb

## 2016-09-20 DIAGNOSIS — L989 Disorder of the skin and subcutaneous tissue, unspecified: Secondary | ICD-10-CM | POA: Diagnosis not present

## 2016-09-20 NOTE — Progress Notes (Signed)
   Subjective:    Patient ID: Janet Burgess, female    DOB: 1940-10-07, 76 y.o.   MRN: [id number]  HPI  Ms. Gal is a 76 year old female with a history of malignant who presents today with a chief complaint of skin lesion. The lesion is located to the right posterior lower extremity over achilles tendon. She first noticed this 1 month ago that she thought was an insect bite. She's tried warm compresses, applied OTC cortisone cream, Bactroban cream, neosporin, and several other medications without improvement. She has noticed tenderness around the lesion as she often hits the lesion when crossing her legs. 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 She denies fevers, swelling, erythema to surrounding site, itching, drainage.   Review of Systems  Constitutional: Negative for fever.  Skin: Positive for color change.       Lesion        Past Medical History:  Diagnosis Date  . Migraine      Social History   Social History  . Marital status: Widowed    Spouse name: N/A  . Number of children: N/A  . Years of education: N/A   Occupational History  . Not on file.   Social History Main Topics  . Smoking status: Current Every Day Smoker    Types: Cigarettes  . Smokeless tobacco: Never Used     Comment: 8 per day  . Alcohol use 0.0 oz/week     Comment: 2 to 3 a week  . Drug use: Unknown  . Sexual activity: Not on file   Other Topics Concern  . Not on file   Social History Narrative   Widowed.    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   Enjoys gardening, stained glass, wood working, spending time on her farm, traveling.       Past Surgical History:  Procedure Laterality Date  . ABDOMINAL HYSTERECTOMY  1974-1975  . BACK SURGERY  [phone]   4 times  . CATARACT EXTRACTION, BILATERAL Bilateral 2014    Family History  Problem Relation Age of Onset  . Breast cancer Mother 66    No Known  Allergies  Current Outpatient Prescriptions on File Prior to Visit  Medication Sig Dispense Refill  . aspirin EC 81 MG tablet Take 81 mg by mouth daily.    . mupirocin cream (BACTROBAN) 2 % Apply 1 application topically 2 (two) times daily. 30 g 1   No current facility-administered medications on file prior to visit.     BP 120/82   Pulse 75   Temp 98.2 F (36.8 C) (Oral)   Ht 4\' 11"  (1.499 m)   Wt 110 lb 1.9 oz (50 kg)   SpO2 97%   BMI 22.24 kg/m    Objective:   Physical Exam  Constitutional: She appears well-nourished.  Cardiovascular: Normal rate.   Pulmonary/Chest: Effort normal.  Skin: Skin is warm and dry.  1/2 cm circular crater like lesion with central crusting. No erythema surrounding lesion. No swelling. No s/s of acute infection.          Assessment & Plan:  Lesion:  Present for the past 1 month. No improvement with OTC treatment. Exam today suspicious for squamous or basal cell carcinoma. Given her history this is very likely. No evidence of cellulitis. Will have her follow up with her dermatologist for further evaluation.  Sheral Flow, NP

## 2016-09-20 NOTE — Patient Instructions (Signed)
Call Dr. Ledell Peoples office for further evaluation of the lesion on your leg. This could be either basal or squamous cell.  It was a pleasure to see you today!

## 2016-09-20 NOTE — Progress Notes (Signed)
Pre visit review using our clinic review tool, if applicable. No additional management support is needed unless otherwise documented below in the visit note. 

## 2016-09-21 ENCOUNTER — Ambulatory Visit: Payer: Self-pay | Admitting: Primary Care

## 2016-09-22 DIAGNOSIS — L570 Actinic keratosis: Secondary | ICD-10-CM | POA: Diagnosis not present

## 2016-09-22 DIAGNOSIS — C44722 Squamous cell carcinoma of skin of right lower limb, including hip: Secondary | ICD-10-CM | POA: Diagnosis not present

## 2016-10-04 DIAGNOSIS — D0339 Melanoma in situ of other parts of face: Secondary | ICD-10-CM | POA: Diagnosis not present

## 2016-10-04 DIAGNOSIS — L905 Scar conditions and fibrosis of skin: Secondary | ICD-10-CM | POA: Diagnosis not present

## 2016-10-05 DIAGNOSIS — D0339 Melanoma in situ of other parts of face: Secondary | ICD-10-CM | POA: Diagnosis not present

## 2016-11-07 DIAGNOSIS — H43811 Vitreous degeneration, right eye: Secondary | ICD-10-CM | POA: Diagnosis not present

## 2016-11-08 ENCOUNTER — Telehealth: Payer: Self-pay | Admitting: Primary Care

## 2016-11-08 ENCOUNTER — Encounter: Payer: Self-pay | Admitting: Physician Assistant

## 2016-11-08 ENCOUNTER — Ambulatory Visit (INDEPENDENT_AMBULATORY_CARE_PROVIDER_SITE_OTHER): Payer: Medicare Other | Admitting: Physician Assistant

## 2016-11-08 ENCOUNTER — Ambulatory Visit
Admission: RE | Admit: 2016-11-08 | Discharge: 2016-11-08 | Disposition: A | Discharge status: Home / Self Care | Payer: Medicare Other | Source: Ambulatory Visit | Attending: Physician Assistant | Admitting: Physician Assistant

## 2016-11-08 VITALS — BP 136/80 | HR 88 | Temp 98.2°F | Ht 59.0 in | Wt 108.0 lb

## 2016-11-08 DIAGNOSIS — M79662 Pain in left lower leg: Secondary | ICD-10-CM | POA: Insufficient documentation

## 2016-11-08 DIAGNOSIS — M7989 Other specified soft tissue disorders: Secondary | ICD-10-CM | POA: Diagnosis not present

## 2016-11-08 DIAGNOSIS — L03116 Cellulitis of left lower limb: Secondary | ICD-10-CM

## 2016-11-08 MED ORDER — DOXYCYCLINE HYCLATE 100 MG PO TABS: 100.0000 mg | ORAL_TABLET | Freq: Two times a day (BID) | ORAL | 0 refills | Status: DC

## 2016-11-08 NOTE — Telephone Encounter (Signed)
[facility] Patient Name: Janet Burgess DOB: 1940-09-01 Initial Comment Caller states c/o lower left leg is red, swollen & warm to touch. She thinks she might have cellulites. Nurse Assessment Nurse: Markus Daft, RN, Sherre Poot Date/Time (Eastern Time): 11/08/2016 11:55:52 AM Confirm and document reason for call. If symptomatic, describe symptoms. ---Caller states c/o lower left leg (pain, red, warm to touch just below the calf) with swelling midway up the calf, and several places of swelling that are knotty. She thinks she might have cellulites. No fever. S/S started with some swelling yesterday and some itching, and worse today. Does the patient have any new or worsening symptoms? ---Yes Will a triage be completed? ---Yes Related visit to physician within the last 2 weeks? ---No Does the PT have any chronic conditions? (i.e. diabetes, asthma, etc.) ---No Is this a behavioral health or substance abuse call? ---No Guidelines Guideline Title Affirmed Question Affirmed Notes Leg Swelling and Edema [1] Red area or streak [2] large (> 2 in. or 5 cm) Final Disposition User See Physician within 4 Hours (or PCP triage) Markus Daft, RN, Sherre Poot Comments Appt made with Inda Coke PA today at 2 pm as there is nothing available at [facility] or [facility]. Referrals REFERRED TO PCP OFFICE Disagree/Comply: Comply

## 2016-11-08 NOTE — Progress Notes (Signed)
Janet Burgess is a 76 y.o. female here for L calf pain.   I acted as as Education administrator for [employer], PA-C Anselmo Pickler, LPN  History of Present Illness:   Chief Complaint  Patient presents with  . Edema    Left calf    Having edema left lower extremity started on Sunday was around ankle area now has spread to lower calf area. Having itching lower calf area yesterday. Has used hydrocortisone cream x 1 on area yesterday and now today put ice on the area this AM. Left calf area red and more edema noted. Denies pain, SOB or chest pain.   No airplane trips, recent immobilizations. No personal hx of blood clots or strokes. 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 Does report a history of lower leg cellulitis several years ago -- no hx of gout. Denies fevers, chills, worsening redness. She does smoke, approx 8 cigarettes daily. She denies recent bug bites or tick bites.  PMHx, SurgHx, SocialHx, Medications, and Allergies were reviewed in the Visit Navigator and updated as appropriate.  Current Medications:   Current Outpatient Prescriptions:  .  aspirin EC 81 MG tablet, Take 81 mg by mouth daily., Disp: , Rfl:  .  mupirocin cream (BACTROBAN) 2 %, Apply 1 application topically 2 (two) times daily., Disp: 30 g, Rfl: 1 .  doxycycline (VIBRA-TABS) 100 MG tablet, Take 1 tablet (100 mg total) by mouth 2 (two) times daily., Disp: 20 tablet, Rfl: 0   Review of Systems:   Review of Systems  Constitutional: Negative for chills, fever, malaise/fatigue and weight loss.  Respiratory: Negative for shortness of breath.   Cardiovascular: Negative for chest pain.  Musculoskeletal: Negative for joint pain.       Swelling and redness and warmth of L lower extremity   Neurological: Negative for dizziness, tingling, tremors and headaches.      Vitals:   Vitals:   11/08/16 1404  BP: 136/80  Pulse: 88  Temp: 98.2 F (36.8 C)  TempSrc: Oral  SpO2: 98%  Weight: 108 lb (49 kg)  Height: 4\' 11"   (1.499 m)     Body mass index is 21.81 kg/m.  Physical Exam:   Physical Exam  Constitutional: She appears well-developed. She is cooperative.  Non-toxic appearance. She does not have a sickly appearance. She does not appear ill. No distress.  Cardiovascular: Normal rate, regular rhythm, S1 normal, S2 normal, normal heart sounds and normal pulses.   No LE edema  Pulmonary/Chest: Effort normal and breath sounds normal.  Musculoskeletal:  Erythema to L lateral ankle with warmth and tenderness located at bottom third of lower calf area, no streaking noted. Calves measuring equal sizes. Negative Homan's sign bilaterally.  Neurological: She is alert.  Nursing note and vitals reviewed.  EXAM: LEFT LOWER EXTREMITY VENOUS DOPPLER ULTRASOUND  FINDINGS: Contralateral Common Femoral Vein: Respiratory phasicity is normal and symmetric with the symptomatic side. No evidence of thrombus. Normal compressibility.  Common Femoral Vein: No evidence of thrombus. Normal compressibility, respiratory phasicity and response to augmentation.  Saphenofemoral Junction: No evidence of thrombus. Normal compressibility and flow on color Doppler imaging.  Profunda Femoral Vein: No evidence of thrombus. Normal compressibility and flow on color Doppler imaging.  Femoral Vein: No evidence of thrombus. Normal compressibility, respiratory phasicity and response to augmentation.  Popliteal Vein: No evidence of thrombus. Normal compressibility, respiratory phasicity and response to augmentation.  Calf Veins: No evidence of thrombus. Normal compressibility and flow on color Doppler imaging.  Superficial Great Saphenous Vein:  No evidence of thrombus. Normal compressibility and flow on color Doppler imaging.  Venous Reflux:  None.  Other Findings:  Soft tissue edema over the lateral malleolus.  IMPRESSION: No evidence of DVT within the left lower extremity.  Assessment and Plan:    Staci  was seen today for edema.  Diagnoses and all orders for this visit:  Pain of left calf  Stat ultrasound of L calf negative for DVT. If symptoms change or develops chest pain/SOB, go to ER. -     US Venous Img Lower Unilateral Left; Future  Cellulitis of L lower extremity Suspect very mild cellulitis. Treat with doxycycline per orders. I advised patient to follow-up with PCP if any worsening symptoms or other concerns. Monitor redness and seek medical attention for worsening redness, fevers or other concerns.   Other orders -     doxycycline (VIBRA-TABS) 100 MG tablet; Take 1 tablet (100 mg total) by mouth 2 (two) times daily.    . Reviewed expectations re: course of current medical issues. . Discussed self-management of symptoms. . Outlined signs and symptoms indicating need for more acute intervention. . Patient verbalized understanding and all questions were answered. . See orders for this visit as documented in the electronic medical record. . Patient received an After-Visit Summary.  CMA or LPN served as scribe during this visit. History, Physical, and Plan performed by medical provider. Documentation and orders reviewed and attested to.  Inda Coke, PA-C

## 2016-11-08 NOTE — Telephone Encounter (Signed)
Pt had appt 11/08/16 at 2 pm with Inda Coke PA.

## 2016-11-08 NOTE — Patient Instructions (Signed)
It was great meeting you!  Start the antibiotic now to cover for cellulitis.  We will send you for ultrasound for your calf. If you develop any severe shortness of breath, chest pain or other concerning symptoms, please go to the emergency room immediately.

## 2016-11-21 DIAGNOSIS — H43811 Vitreous degeneration, right eye: Secondary | ICD-10-CM | POA: Diagnosis not present

## 2016-11-22 DIAGNOSIS — L905 Scar conditions and fibrosis of skin: Secondary | ICD-10-CM | POA: Diagnosis not present

## 2016-11-22 DIAGNOSIS — Z85828 Personal history of other malignant neoplasm of skin: Secondary | ICD-10-CM | POA: Diagnosis not present

## 2016-11-22 DIAGNOSIS — L570 Actinic keratosis: Secondary | ICD-10-CM | POA: Diagnosis not present

## 2016-12-19 ENCOUNTER — Other Ambulatory Visit: Payer: Self-pay

## 2017-01-02 DIAGNOSIS — H43811 Vitreous degeneration, right eye: Secondary | ICD-10-CM | POA: Diagnosis not present

## 2017-02-14 DIAGNOSIS — L718 Other rosacea: Secondary | ICD-10-CM | POA: Diagnosis not present

## 2017-02-14 DIAGNOSIS — Z85828 Personal history of other malignant neoplasm of skin: Secondary | ICD-10-CM | POA: Diagnosis not present

## 2017-02-20 DIAGNOSIS — L720 Epidermal cyst: Secondary | ICD-10-CM | POA: Diagnosis not present

## 2017-04-05 DIAGNOSIS — L720 Epidermal cyst: Secondary | ICD-10-CM | POA: Diagnosis not present

## 2017-07-11 ENCOUNTER — Other Ambulatory Visit: Payer: Self-pay | Admitting: Primary Care

## 2017-07-11 DIAGNOSIS — R7303 Prediabetes: Secondary | ICD-10-CM

## 2017-07-11 DIAGNOSIS — E785 Hyperlipidemia, unspecified: Secondary | ICD-10-CM

## 2017-07-11 DIAGNOSIS — M816 Localized osteoporosis [Lequesne]: Secondary | ICD-10-CM

## 2017-07-17 ENCOUNTER — Other Ambulatory Visit (INDEPENDENT_AMBULATORY_CARE_PROVIDER_SITE_OTHER): Payer: Medicare Other

## 2017-07-17 DIAGNOSIS — E785 Hyperlipidemia, unspecified: Secondary | ICD-10-CM

## 2017-07-17 DIAGNOSIS — R7303 Prediabetes: Secondary | ICD-10-CM | POA: Diagnosis not present

## 2017-07-17 DIAGNOSIS — M816 Localized osteoporosis [Lequesne]: Secondary | ICD-10-CM

## 2017-07-17 LAB — COMPREHENSIVE METABOLIC PANEL
ALBUMIN: 4.2 g/dL (ref 3.5–5.2)
ALK PHOS: 62 U/L (ref 39–117)
ALT: 14 U/L (ref 0–35)
AST: 16 U/L (ref 0–37)
BILIRUBIN TOTAL: 0.4 mg/dL (ref 0.2–1.2)
BUN: 18 mg/dL (ref 6–23)
CO2: 27 mEq/L (ref 19–32)
Calcium: 9.3 mg/dL (ref 8.4–10.5)
Chloride: 105 mEq/L (ref 96–112)
Creatinine, Ser: 0.64 mg/dL (ref 0.40–1.20)
GFR: 96.29 mL/min (ref >60.00)
GLUCOSE: 108 mg/dL — AB (ref 70–99)
POTASSIUM: 4.7 meq/L (ref 3.5–5.1)
Sodium: 139 mEq/L (ref 135–145)
TOTAL PROTEIN: 6.7 g/dL (ref 6.0–8.3)

## 2017-07-17 LAB — LIPID PANEL
CHOLESTEROL: 215 mg/dL — AB (ref 0–200)
HDL: 65.8 mg/dL (ref >39.00)
LDL Cholesterol: 127 mg/dL — ABNORMAL HIGH (ref 0–99)
NONHDL: 149.08
Total CHOL/HDL Ratio: 3
Triglycerides: 111 mg/dL (ref 0.0–149.0)
VLDL: 22.2 mg/dL (ref 0.0–40.0)

## 2017-07-17 LAB — HEMOGLOBIN A1C: HEMOGLOBIN A1C: 5.9 % (ref 4.6–6.5)

## 2017-07-17 LAB — VITAMIN D 25 HYDROXY (VIT D DEFICIENCY, FRACTURES): VITD: 37.79 ng/mL (ref 30.00–100.00)

## 2017-07-17 IMAGING — US US EXTREM LOW VENOUS*L*
1 series · 13 of 24 positions shown · non-contrast
Comparison: None.

CLINICAL DATA: Swelling, warmth, redness to the left calf



[Series 1: us extrem low venous*left* · 0.07mm/px · 13 of 37 slices shown]
[im 1/37]
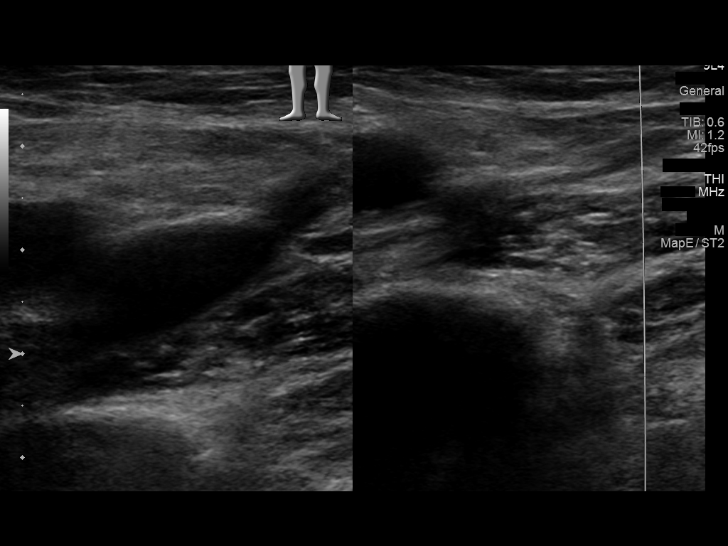
[im 4/37]
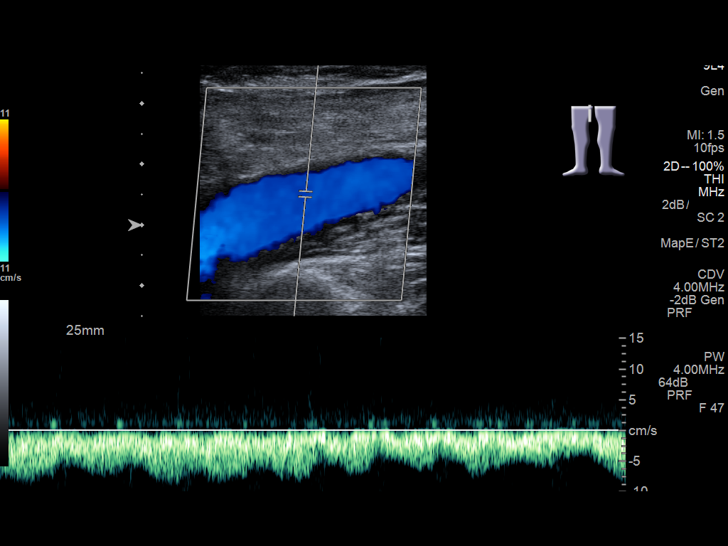
[im 7/37]
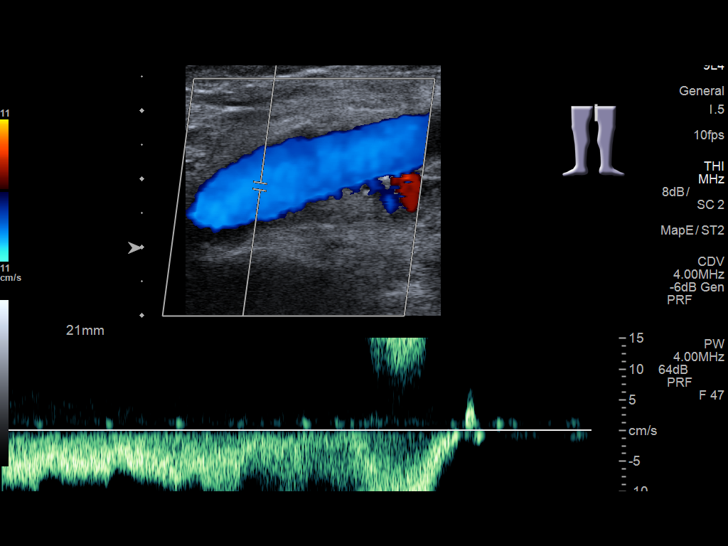
[im 10/37]
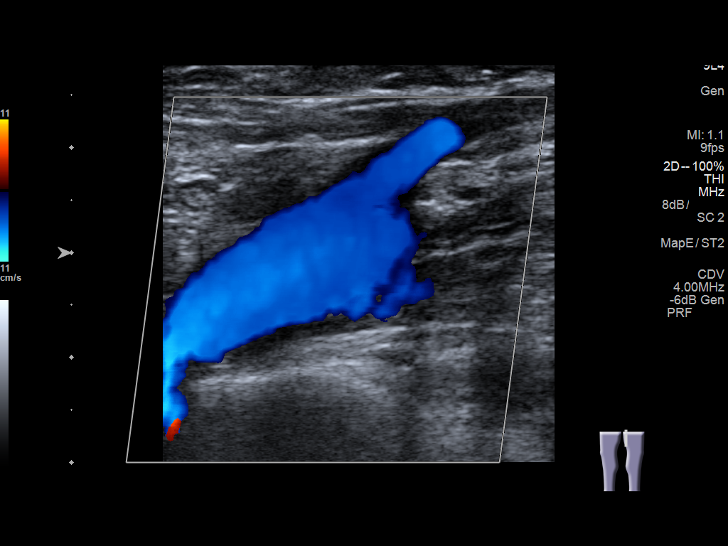
[im 13/37]
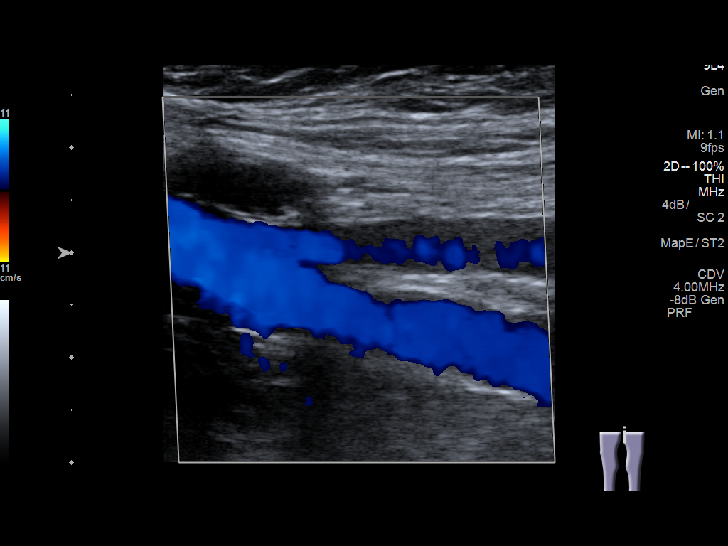
[im 16/37]
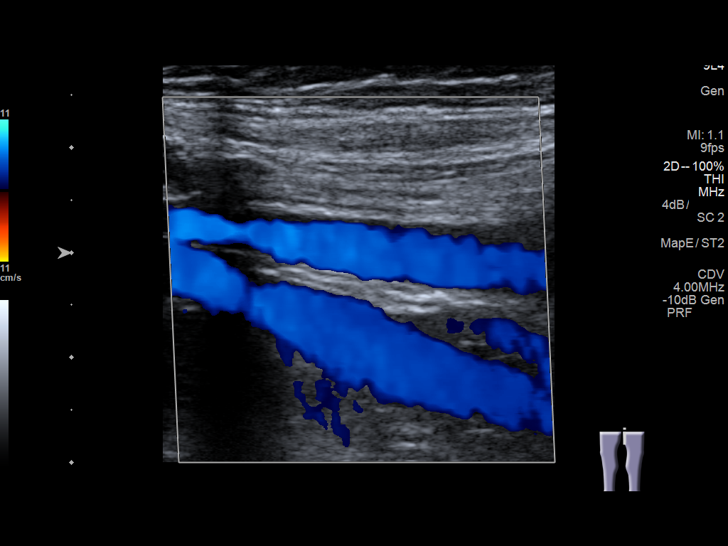
[im 19/37]
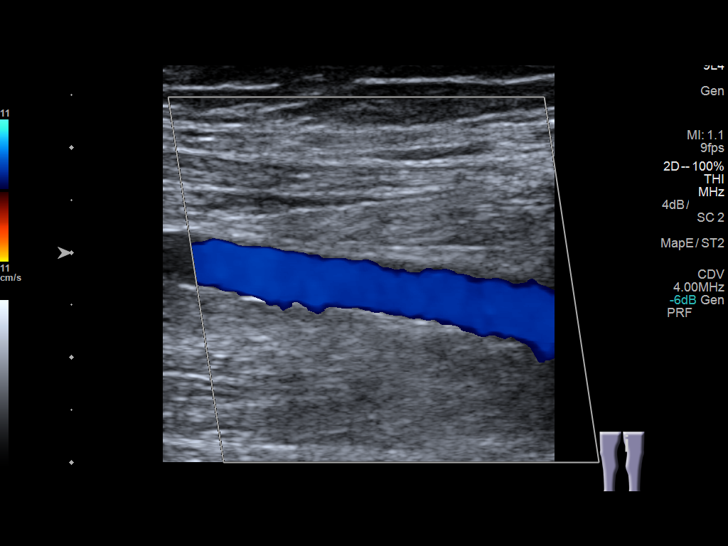
[im 21/37]
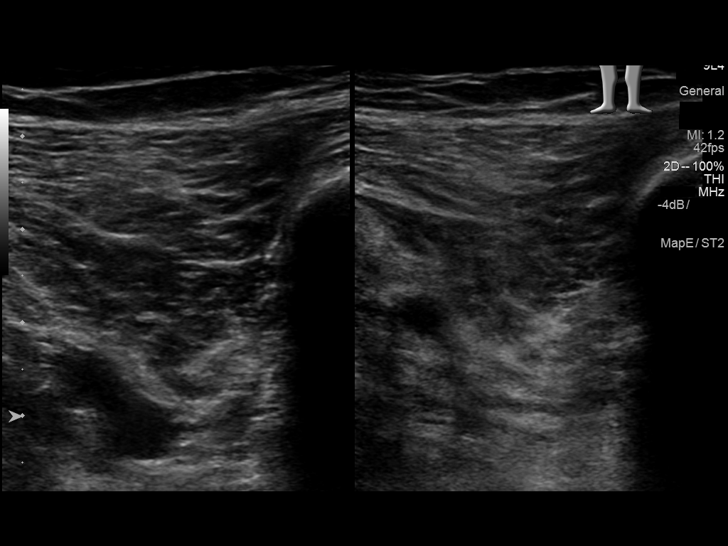
[im 24/37]
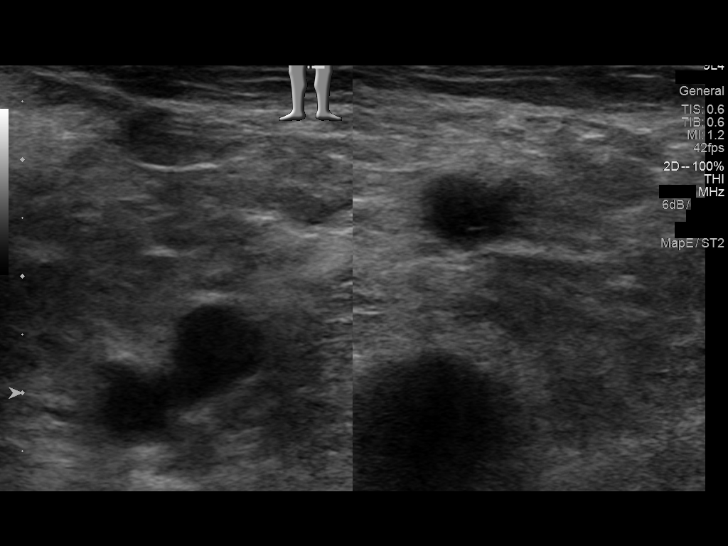
[im 27/37]
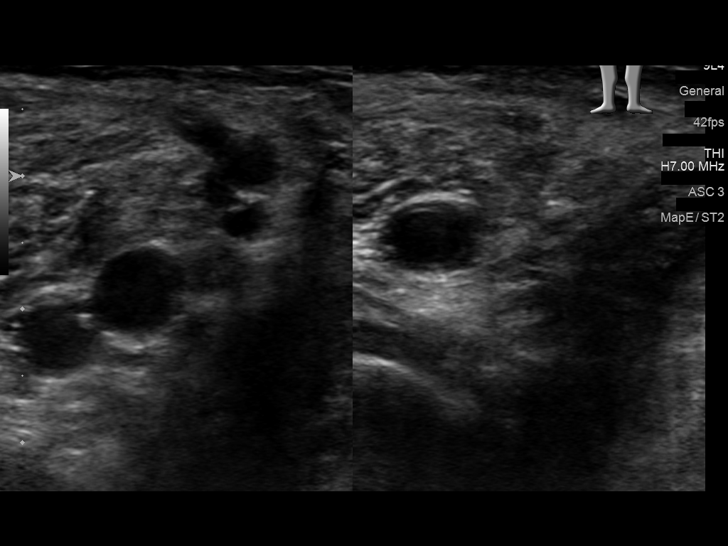
[im 30/37]
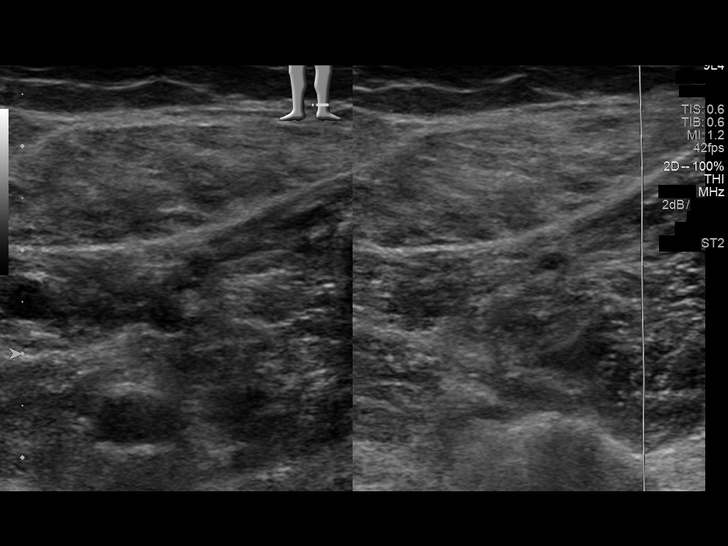
[im 33/37]
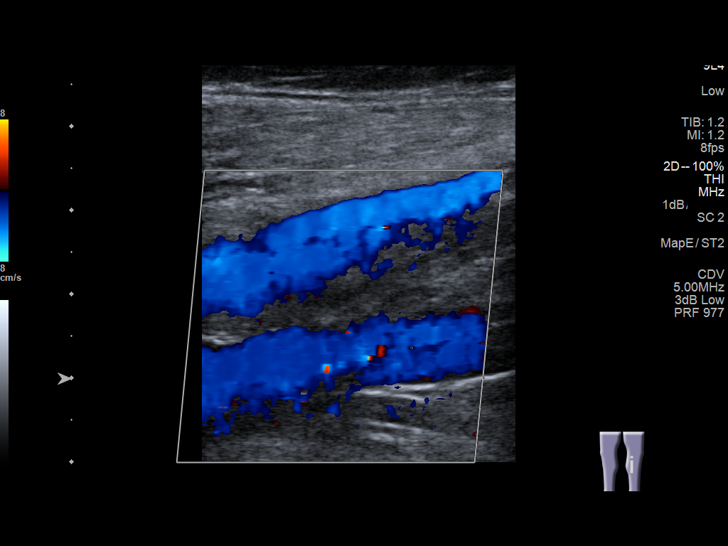
[im 37/37]
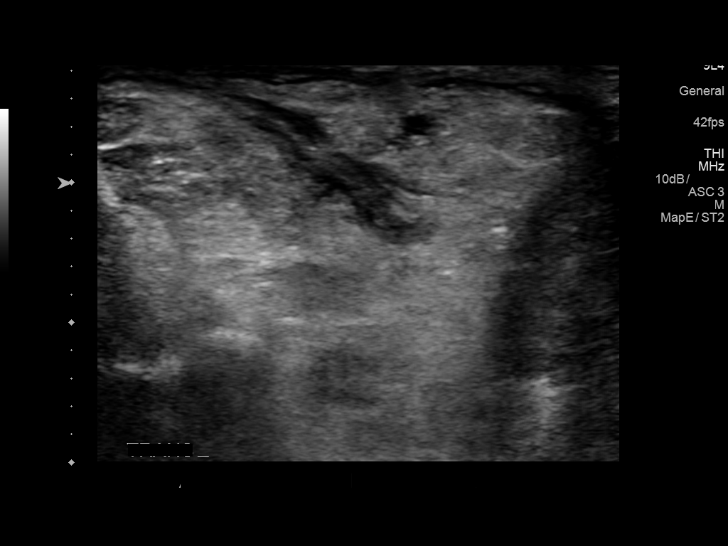

[13 of 24 positions shown; findings below may reference images not displayed]

FINDINGS: Contralateral Common Femoral Vein: Respiratory phasicity is normal
and symmetric with the symptomatic side. No evidence of thrombus.
Normal compressibility.

Common Femoral Vein: No evidence of thrombus. Normal
compressibility, respiratory phasicity and response to augmentation.

Saphenofemoral Junction: No evidence of thrombus. Normal
compressibility and flow on color Doppler imaging.

Profunda Femoral Vein: No evidence of thrombus. Normal
compressibility and flow on color Doppler imaging.

Femoral Vein: No evidence of thrombus. Normal compressibility,
respiratory phasicity and response to augmentation.

Popliteal Vein: No evidence of thrombus. Normal compressibility,
respiratory phasicity and response to augmentation.

Calf Veins: No evidence of thrombus. Normal compressibility and flow
on color Doppler imaging.

Superficial Great Saphenous Vein: No evidence of thrombus. Normal
compressibility and flow on color Doppler imaging.

Venous Reflux:  None.

Other Findings:  Soft tissue edema over the lateral malleolus.
IMPRESSION: No evidence of DVT within the left lower extremity.

## 2017-07-21 ENCOUNTER — Ambulatory Visit (INDEPENDENT_AMBULATORY_CARE_PROVIDER_SITE_OTHER): Payer: Medicare Other | Admitting: Primary Care

## 2017-07-21 ENCOUNTER — Encounter: Payer: Self-pay | Admitting: Primary Care

## 2017-07-21 VITALS — BP 118/74 | HR 72 | Temp 98.0°F | Ht 59.0 in | Wt 109.5 lb

## 2017-07-21 DIAGNOSIS — M816 Localized osteoporosis [Lequesne]: Secondary | ICD-10-CM

## 2017-07-21 DIAGNOSIS — Z720 Tobacco use: Secondary | ICD-10-CM | POA: Diagnosis not present

## 2017-07-21 DIAGNOSIS — E785 Hyperlipidemia, unspecified: Secondary | ICD-10-CM | POA: Diagnosis not present

## 2017-07-21 DIAGNOSIS — Z1231 Encounter for screening mammogram for malignant neoplasm of breast: Secondary | ICD-10-CM

## 2017-07-21 DIAGNOSIS — Z1239 Encounter for other screening for malignant neoplasm of breast: Secondary | ICD-10-CM

## 2017-07-21 DIAGNOSIS — Z0000 Encounter for general adult medical examination without abnormal findings: Secondary | ICD-10-CM

## 2017-07-21 DIAGNOSIS — R7303 Prediabetes: Secondary | ICD-10-CM

## 2017-07-21 MED ORDER — VARENICLINE TARTRATE 1 MG PO TABS: 1.0000 mg | ORAL_TABLET | Freq: Two times a day (BID) | ORAL | 0 refills | Status: DC

## 2017-07-21 MED ORDER — IBANDRONATE SODIUM 150 MG PO TABS: ORAL_TABLET | ORAL | 3 refills | Status: DC

## 2017-07-21 MED ORDER — VARENICLINE TARTRATE 0.5 MG X 11 & 1 MG X 42 PO MISC: ORAL | 0 refills | Status: DC

## 2017-07-21 NOTE — Assessment & Plan Note (Signed)
Osteoporosis from bone density in March 2018. In the past she's refused bisphosphonate treatment, also cannot swallow calcium tablets. Agreeable today to [facility], Rx sent to pharmacy. Discussed instructions for use. Repeat bone density next year.

## 2017-07-21 NOTE — Patient Instructions (Signed)
Start Boniva tablets for osteoporosis. Take 1 tablet by mouth once monthly. Take on an empty stomach with water and avoid eating or laying down within 30 minutes after taking.  Start Vitamin D as discussed.   Start Chantix starting pak, then onto the continuing pak. You must choose a stop date before you start, this needs to be within the first 1-2 weeks.  Continue to work on your diet and get plenty of exercise.  Ensure you are consuming 64 ounces of water daily.  Call the [facility] to schedule your mammogram.  Follow up in 1 year for your annual exam or sooner if needed.  It was a pleasure to see you today!  Ibandronate monthly tablets What is this medicine? IBANDRONATE (i BAN droh nate) slows calcium loss from bones. It is used to treat osteoporosis in women past the age of menopause. This medicine may be used for other purposes; ask your health care provider or pharmacist if you have questions. COMMON BRAND NAME(S): Boniva What should I tell my health care provider before I take this medicine? They need to know if you have any of these conditions: -dental disease -esophageal, stomach, or intestine problems, like acid reflux or GERD -kidney disease -low blood calcium -low vitamin D -problems sitting or standing for 60 minutes -trouble swallowing -an unusual or allergic reaction to ibandronate, other medicines, foods, dyes, or preservatives -pregnant or trying to get pregnant -breast-feeding How should I use this medicine? You must take this medicine exactly as directed or you will lower the amount of medicine you absorb into your body or you may cause yourself harm. Take your dose by mouth first thing in the morning, after you are up for the day. Do not eat or drink anything before you take this medicine. Swallow the tablet with a full glass (6 to 8 ounces) of plain water. Do not take this medicine with any other drink. Do not chew or crush the tablet. After taking  this medicine, do not eat breakfast, drink, or take any other medicines or vitamins for at least 1 hour. Stand or sit up for at least 1 hour after taking this medicine. Do not lie down. Take this medicine on the same day every month. Do not take your medicine more often than directed. Talk to your pediatrician regarding the use of this medicine in children. Special care may be needed. Overdosage: If you think you have taken too much of this medicine contact a poison control center or emergency room at once. NOTE: This medicine is only for you. Do not share this medicine with others. What if I miss a dose? If you miss a dose and your next dose is more than 7 days away then take the missed dose on the next morning you remember. Then take your next dose on your regular day of the month. If your next dose is due within the next 7 days then skip the missed dose. Do not take 2 tablets within 1 week of each other. Do not take double or extra doses. What may interact with this medicine? -aluminum hydroxide -antacids -aspirin -calcium supplements -drugs for inflammation like ibuprofen, naproxen, and others -iron supplements -magnesium supplements -vitamins with minerals This list may not describe all possible interactions. Give your health care provider a list of all the medicines, herbs, non-prescription drugs, or dietary supplements you use. Also tell them if you smoke, drink alcohol, or use illegal drugs. Some items may interact with your medicine. What  should I watch for while using this medicine? Visit your doctor or health care professional for regular check ups. It may be some time before you see the benefit from this medicine. Do not stop taking your medicine unless your doctor tells you to. Your doctor may order blood tests and other tests to see how you are doing. You should make sure that you get enough calcium and vitamin D while you are taking this medicine. Discuss the foods you eat and the  vitamins you take with your health care professional. Some people who take this medicine have severe bone, joint, and/or muscle pain. This medicine may also increase your risk for a broken thigh bone. Tell your doctor right away if you have pain in your upper leg or groin. Tell your doctor if you have any pain that does not go away or that gets worse. What side effects may I notice from receiving this medicine? Side effects that you should report to your doctor or health care professional as soon as possible: -allergic reactions such as skin rash or itching, hives, swelling of the face, lips, throat or tongue -black or tarry stools -change in vision -chest pain -heartburn or stomach pain -jaw pain, especially after dental work -redness, blistering, peeling, or loosening of the skin, including inside the mouth -trouble or pain when swallowing Side effects that usually do not require medical attention (report to your doctor or health care professional if they continue or are bothersome): -bone, muscle or joint pain -changes in taste -diarrhea or constipation -headache -nausea or vomiting -stomach gas or fullness This list may not describe all possible side effects. Call your doctor for medical advice about side effects. You may report side effects to FDA at 1-800-FDA-1088. Where should I keep my medicine? Keep out of the reach of children. Store at room temperature between 15 and 30 degrees C (59 and 86 degrees F). Throw away any unused medication after the expiration date. NOTE: This sheet is a summary. It may not cover all possible information. If you have questions about this medicine, talk to your doctor, pharmacist, or health care provider.  2018 Elsevier/Gold Standard (2010-11-12 09:03:09)

## 2017-07-21 NOTE — Assessment & Plan Note (Signed)
Immunizations UTD, mammogram due in Spring 2019 (order placed), declines colonoscopy. Interested in tobacco cessation, smoking 4-5 cigarettes daily and is ready to quit, treated. Discussed to continue regular activity, healthy diet. Exam unremarkable, labs improved.   I have personally reviewed and have noted: 1. The patient's medical and social history 2. Their use of alcohol, tobacco or illicit drugs 3. Their current medications and supplements 4. The patient's functional ability including ADL's, fall  risks, home safety risks and hearing or  visual impairment. 5. Diet and physical activities 6. Evidence for depression or mood disorder

## 2017-07-21 NOTE — Assessment & Plan Note (Signed)
Stable at 5.9 for which has been the case for the past three years.

## 2017-07-21 NOTE — Assessment & Plan Note (Signed)
Currently smoking 5 cigarettes daily, ready to quit completely. Discussed options, she's very interested in Chantix as she's failed OTC options.   Trial of chantix. Common side effects including rare risk of suicide ideation was discussed with the patient today.  Patient is instructed to go directly to the ED if this occurs.  We discussed that patient can continue to smoke for 1 week after starting chantix, but then must discontinue cigarettes.    5 minutes spent with patient today on tobacco cessation counseling.

## 2017-07-21 NOTE — Assessment & Plan Note (Signed)
Improved from labs last year, continue to work on a healthy diet and regular exercise.

## 2017-07-21 NOTE — Progress Notes (Signed)
Patient ID: Janet Burgess, female   DOB: 1940/10/19, 77 y.o.   MRN: [id number]  HPI: Janet Burgess is a 77 year old female who presents today for Barren.   Past Medical History:  Diagnosis Date  . Migraine     Current Outpatient Medications  Medication Sig Dispense Refill  . aspirin EC 81 MG tablet Take 81 mg by mouth daily.    . mupirocin cream (BACTROBAN) 2 % Apply 1 application topically 2 (two) times daily. (Patient not taking: Reported on 07/21/2017) 30 g 1   No current facility-administered medications for this visit.     No Known Allergies  Family History  Problem Relation Age of Onset  . Breast cancer Mother 49    Social History   Socioeconomic History  . Marital status: Widowed    Spouse name: Not on file  . Number of children: Not on file  . Years of education: Not on file  . Highest education level: Not on file  Social Needs  . Financial resource strain: Not on file  . Food insecurity - worry: Not on file  . Food insecurity - inability: Not on file  . Transportation needs - medical: Not on file  . Transportation needs - non-medical: Not on file  Occupational History  . Not on file  Tobacco Use  . Smoking status: Current Every Day Smoker    Types: Cigarettes  . Smokeless tobacco: Never Used  . Tobacco comment: 8 per day  Substance and Sexual Activity  . Alcohol use: Yes    Alcohol/week: 0.0 oz    Comment: 2 to 3 a week  . Drug use: Not on file  . Sexual activity: Not on file  Other Topics Concern  . Not on file  Social History Narrative   Widowed.    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   Enjoys gardening, stained glass, wood working, spending time on her farm, traveling.    Hospitiliaztions: None  Health Maintenance:    Flu: Completed this season  Tetanus: Completed in 2014  Pneumovax: Completed in 2013  Prevnar: Completed in 2014  Zostavax: Completed in 2014, Shingrix in 2018  Bone Density: Completed  in 2018, osteoporosis. Not taking Calcium or  Vitamin D. Cannot swallow calcium.   Colonoscopy: Never completed  Eye Doctor: Completed in March 2018  Dental Exam: Completes semi-annually  Mammogram: Completed in 2018     Providers: Alma Friendly, PCP.    I have personally reviewed and have noted: 1. The patient's medical and social history 2. Their use of alcohol, tobacco or illicit drugs 3. Their current medications and supplements 4. The patient's functional ability including ADL's, fall risks, home safety risks  and hearing or visual impairment. 5. Diet and physical activities 6. Evidence for depression or mood disorder  Subjective:   Review of Systems:   Constitutional: Denies fever, malaise, fatigue, headache or abrupt weight changes.  HEENT: Denies eye pain, eye redness, ear pain, ringing in the ears, wax buildup, runny nose, nasal congestion, bloody nose, or sore throat. Respiratory: Denies difficulty breathing, shortness of breath, cough or sputum production.   Cardiovascular: Denies chest pain, chest tightness, palpitations or swelling in the hands or feet.  Gastrointestinal: Denies abdominal pain, bloating, constipation, diarrhea or blood in the stool.  GU: Denies urgency, frequency, pain with urination, burning sensation, blood in urine, odor or discharge. Musculoskeletal: Denies decrease in range of motion, difficulty with gait, muscle pain or joint  pain and swelling.  Skin: Denies redness, rashes, lesions or ulcercations.  Neurological: Denies dizziness, difficulty with memory, difficulty with speech or problems with balance and coordination.  Psychiatric: Denies concerns for anxiety or depression.   No other specific complaints in a complete review of systems (except as listed in HPI above).  Objective:  PE:   BP 118/74   Pulse 72   Temp 98 F (36.7 C) (Oral)   Ht 4\' 11"  (1.499 m)   Wt 109 lb 8 oz (49.7 kg)   SpO2 98%   BMI 22.12 kg/m  Wt Readings from  Last 3 Encounters:  07/21/17 109 lb 8 oz (49.7 kg)  11/08/16 108 lb (49 kg)  09/20/16 110 lb 1.9 oz (50 kg)    General: Appears their stated age, well developed, well nourished in NAD. Skin: Warm, dry and intact. No rashes, lesions or ulcerations noted. HEENT: Head: normal shape and size; Eyes: sclera white, no icterus, conjunctiva pink, PERRLA and EOMs intact; Ears: Tm's gray and intact, normal light reflex; Nose: mucosa pink and moist, septum midline; Throat/Mouth: Teeth present, mucosa pink and moist, no exudate, lesions or ulcerations noted.  Neck: Normal range of motion. Neck supple, trachea midline. No massses, lumps or thyromegaly present.  Cardiovascular: Normal rate and rhythm. S1,S2 noted.  No murmur, rubs or gallops noted. No JVD or BLE edema. No carotid bruits noted. Pulmonary/Chest: Normal effort and positive vesicular breath sounds. No respiratory distress. No wheezes, rales or ronchi noted.  Abdomen: Soft and nontender. Normal bowel sounds, no bruits noted. No distention or masses noted. Liver, spleen and kidneys non palpable. Musculoskeletal: Normal range of motion. No signs of joint swelling. No difficulty with gait.  Neurological: Alert and oriented. Cranial nerves II-XII intact. Coordination normal. +DTRs bilaterally. Psychiatric: Mood and affect normal. Behavior is normal. Judgment and thought content normal.    BMET    Component Value Date/Time   NA 139 07/17/2017 0814   K 4.7 07/17/2017 0814   CL 105 07/17/2017 0814   CO2 27 07/17/2017 0814   GLUCOSE 108 (H) 07/17/2017 0814   BUN 18 07/17/2017 0814   CREATININE 0.64 07/17/2017 0814   CALCIUM 9.3 07/17/2017 0814    Lipid Panel     Component Value Date/Time   CHOL 215 (H) 07/17/2017 0814   TRIG 111.0 07/17/2017 0814   HDL 65.80 07/17/2017 0814   CHOLHDL 3 07/17/2017 0814   VLDL 22.2 07/17/2017 0814   LDLCALC 127 (H) 07/17/2017 0814    CBC    Component Value Date/Time   WBC 7.9 05/25/2016 0826   RBC  4.53 05/25/2016 0826   HGB 14.9 05/25/2016 0826   HCT 43.6 05/25/2016 0826   PLT 249.0 05/25/2016 0826   MCV 96.2 05/25/2016 0826   MCHC 34.1 05/25/2016 0826   RDW 14.6 05/25/2016 0826   LYMPHSABS 2.7 05/25/2016 0826   MONOABS 0.7 05/25/2016 0826   EOSABS 0.1 05/25/2016 0826   BASOSABS 0.0 05/25/2016 0826    Hgb A1C Lab Results  Component Value Date   HGBA1C 5.9 07/17/2017      Assessment and Plan:   Medicare Annual Wellness Visit:  Diet: She endorses a healthy diet Breakfast: English muffin, fruit, some restaurant food Lunch: English muffin, fruit, cheese Dinner: Salad, nuts almonds Snacks: Not usually  Desserts: Fruit Beverages: Water, crystal light, diet soda Physical activity: She is walking, active in her gardening Depression/mood screen: Negative Hearing: Intact to whispered voice Visual acuity: Grossly normal, performs annual eye exam  ADLs: Capable Fall risk: None Home safety: Good Cognitive evaluation: Intact to orientation, naming, recall and repetition EOL planning: Adv directives, full code/ I agree  Preventative Medicine: Immunizations UTD, mammogram due in Spring 2019 (order placed), declines colonoscopy. Interested in tobacco cessation, smoking 4-5 cigarettes daily and is ready to quit, treated. Discussed to continue regular activity, healthy diet. Exam unremarkable, labs improved.   Next appointment: One year.

## 2017-08-14 DIAGNOSIS — L298 Other pruritus: Secondary | ICD-10-CM | POA: Diagnosis not present

## 2017-08-14 DIAGNOSIS — Z85820 Personal history of malignant melanoma of skin: Secondary | ICD-10-CM | POA: Diagnosis not present

## 2017-08-29 ENCOUNTER — Ambulatory Visit
Admission: RE | Admit: 2017-08-29 | Discharge: 2017-08-29 | Disposition: A | Discharge status: Home / Self Care | Payer: Medicare Other | Source: Ambulatory Visit | Attending: Primary Care | Admitting: Primary Care

## 2017-08-29 DIAGNOSIS — Z1231 Encounter for screening mammogram for malignant neoplasm of breast: Secondary | ICD-10-CM | POA: Diagnosis not present

## 2017-08-29 DIAGNOSIS — Z1239 Encounter for other screening for malignant neoplasm of breast: Secondary | ICD-10-CM

## 2017-11-14 DIAGNOSIS — B029 Zoster without complications: Secondary | ICD-10-CM | POA: Diagnosis not present

## 2018-01-19 DIAGNOSIS — H43811 Vitreous degeneration, right eye: Secondary | ICD-10-CM | POA: Diagnosis not present

## 2018-12-31 ENCOUNTER — Other Ambulatory Visit: Payer: Self-pay

## 2019-02-25 DIAGNOSIS — Z23 Encounter for immunization: Secondary | ICD-10-CM | POA: Diagnosis not present

## 2019-04-24 ENCOUNTER — Other Ambulatory Visit: Payer: Self-pay

## 2019-08-14 ENCOUNTER — Encounter: Payer: Self-pay | Admitting: Family Medicine

## 2019-08-14 ENCOUNTER — Other Ambulatory Visit: Payer: Self-pay

## 2019-08-14 ENCOUNTER — Ambulatory Visit (INDEPENDENT_AMBULATORY_CARE_PROVIDER_SITE_OTHER)
Admission: RE | Admit: 2019-08-14 | Discharge: 2019-08-14 | Disposition: A | Discharge status: Home / Self Care | Payer: Medicare Other | Source: Ambulatory Visit | Attending: Family Medicine | Admitting: Family Medicine

## 2019-08-14 ENCOUNTER — Ambulatory Visit (INDEPENDENT_AMBULATORY_CARE_PROVIDER_SITE_OTHER): Payer: Medicare Other | Admitting: Family Medicine

## 2019-08-14 VITALS — BP 138/88 | HR 84 | Temp 98.3°F | Ht 58.5 in | Wt 109.8 lb

## 2019-08-14 DIAGNOSIS — M25571 Pain in right ankle and joints of right foot: Secondary | ICD-10-CM

## 2019-08-14 DIAGNOSIS — M19071 Primary osteoarthritis, right ankle and foot: Secondary | ICD-10-CM | POA: Diagnosis not present

## 2019-08-14 DIAGNOSIS — M19079 Primary osteoarthritis, unspecified ankle and foot: Secondary | ICD-10-CM

## 2019-08-14 MED ORDER — CELECOXIB 200 MG PO CAPS: 200.0000 mg | ORAL_CAPSULE | Freq: Every day | ORAL | 2 refills | Status: DC

## 2019-08-14 NOTE — Progress Notes (Signed)
[name] T. Celedonio Sortino, MD Primary Care and Sports Medicine [facility] [place], [address] Phone: [phone]  FAX: [phone]  Janet Burgess - 79 y.o. female  MRN [id number]  Date of Birth: 16-Apr-1941  Visit Date: 08/14/2019  PCP: Pleas Koch, NP  Referred by: Pleas Koch, NP  Chief Complaint  Patient presents with  . Foot Pain    Right    This visit occurred during the SARS-CoV-2 public health emergency.  Safety protocols were in place, including screening questions prior to the visit, additional usage of staff PPE, and extensive cleaning of exam room while observing appropriate contact time as indicated for disinfecting solutions.   Subjective:   Janet Burgess is a 79 y.o. very pleasant female patient with Body mass index is 22.55 kg/m. who presents with the following:  R foot pain: She has not had any sort of specific injury that she recalls, but 6 or 7 weeks ago she did have some pain in the ankle and started to get some swelling on the dorsal aspect of the foot in the region of her ankle joint as well as distal to this.  She has no significant prior history of ankle injuries or operations.  She is on her feet a great deal during the day, and she tells me that she routinely has 25,000 steps in a day.  [place] does [place] and walk routinely multiple times around in her neighborhood today.  She also does have some aching and will throb at nighttime.  No known history.  About six or seven weeeks ago started to get swelling meddially and laterally.  THrobs durrring the night.  On her feet a lot.  Will monitor her steps.  25, 000  [place] acres.    Review of Systems is noted in the HPI, as appropriate   Objective:   BP 138/88   Pulse 84   Temp 98.3 F (36.8 C) (Temporal)   Ht 4' 10.5" (1.486 m)   Wt 109 lb 12 oz (49.8 kg)   SpO2 96%   BMI 22.55 kg/m   GEN: No acute distress;  alert,appropriate. PULM: Breathing comfortably in no respiratory distress PSYCH: Normally interactive.   The entirety of the forefoot is nontender.  The great toe has a significant bunion and has minimal to no movement in any direction.  In the forefoot there is some edema more medially.  There is no gross tenderness to palpation of the posterior tib or peroneal tendons.  There is no focal tenderness in any of the bones in the midfoot, but there is some mild tenderness at the swelling.  Nontender at the medial and lateral malleolus, the ATFL, CFL, or deltoid ligaments.  She does have pain in and about the talus itself and this is where the maximal swelling is.  Strength is intact.  Neurovascularly intact.  Radiology: DG Ankle Complete Right  Result Date: 08/14/2019 CLINICAL DATA:  Ankle pain EXAM: RIGHT ANKLE - COMPLETE 3+ VIEW COMPARISON:  None. FINDINGS: There are mild-to-moderate degenerative changes of the ankle mortise. There is no acute displaced fracture or dislocation. There is no surrounding soft tissue swelling. Vascular calcifications are noted. IMPRESSION: No acute displaced fracture or dislocation. Mild-to-moderate degenerative changes of the ankle mortise. Electronically Signed   By: Constance Holster M.D.   On: 08/14/2019 15:35     Assessment and Plan:     ICD-10-CM   1.  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 Acute right ankle pain  M25.571 DG Ankle Complete Right  3. 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  She also has some midfoot arthritis.  This is driving her swelling in her pain.  Arthritis exacerbation.  We discussed what this means.  Compression sleeve while up on her feet would likely help with some of the swelling.  Change NSAIDs to Celebrex.  Ice after exercise.  She understands that this will be a problem off and on indefinitely.  Follow-up: No follow-ups on file.  Meds ordered this encounter  Medications  . celecoxib  (CELEBREX) 200 MG capsule    Sig: Take 1 capsule (200 mg total) by mouth daily.    Dispense:  30 capsule    Refill:  2   Medications Discontinued During This Encounter  Medication Reason  . ibandronate (BONIVA) 150 MG tablet Completed Course  . varenicline (CHANTIX CONTINUING MONTH PAK) 1 MG tablet Completed Course  . varenicline (CHANTIX STARTING MONTH PAK) 0.5 MG X 11 & 1 MG X 42 tablet Completed Course   Orders Placed This Encounter  Procedures  . DG Ankle Complete Right    Signed,  Alleta Avery T. Messiah Ahr, MD   Outpatient Encounter Medications as of 08/14/2019  Medication Sig  . aspirin EC 81 MG tablet Take 81 mg by mouth daily.  . mupirocin cream (BACTROBAN) 2 % Apply 1 application topically 2 (two) times daily.  . celecoxib (CELEBREX) 200 MG capsule Take 1 capsule (200 mg total) by mouth daily.  . [DISCONTINUED] ibandronate (BONIVA) 150 MG tablet Take 1 tablet by mouth once monthly. Take in the morning with a full glass of water, on an empty stomach, do not eat or lie down for 30 min.  . [DISCONTINUED] varenicline (CHANTIX CONTINUING MONTH PAK) 1 MG tablet Take 1 tablet (1 mg total) by mouth 2 (two) times daily.  . [DISCONTINUED] varenicline (CHANTIX STARTING MONTH PAK) 0.5 MG X 11 & 1 MG X 42 tablet Take one 0.5 mg by mouth once daily for 3 days, then increase to one 0.5 mg twice daily for 4 days, then increase to one 1 mg twice daily.   No facility-administered encounter medications on file as of 08/14/2019.

## 2019-11-11 ENCOUNTER — Other Ambulatory Visit: Payer: Self-pay | Admitting: Family Medicine

## 2019-11-11 NOTE — Telephone Encounter (Signed)
Last office visit 08/14/2019 with Dr. Lorelei Pont for Ankle Arthritis.  Last refilled 08/14/2019 for #30 with 2 refills.  No future appointments.

## 2019-11-20 ENCOUNTER — Other Ambulatory Visit: Payer: Self-pay | Admitting: Primary Care

## 2019-11-25 ENCOUNTER — Other Ambulatory Visit: Payer: Self-pay | Admitting: Primary Care

## 2019-11-25 DIAGNOSIS — E785 Hyperlipidemia, unspecified: Secondary | ICD-10-CM

## 2019-11-25 DIAGNOSIS — R7303 Prediabetes: Secondary | ICD-10-CM

## 2019-11-25 DIAGNOSIS — Z1159 Encounter for screening for other viral diseases: Secondary | ICD-10-CM

## 2019-12-04 ENCOUNTER — Other Ambulatory Visit: Payer: Self-pay

## 2019-12-04 ENCOUNTER — Other Ambulatory Visit (INDEPENDENT_AMBULATORY_CARE_PROVIDER_SITE_OTHER): Payer: Medicare Other

## 2019-12-04 DIAGNOSIS — Z1159 Encounter for screening for other viral diseases: Secondary | ICD-10-CM

## 2019-12-04 DIAGNOSIS — E785 Hyperlipidemia, unspecified: Secondary | ICD-10-CM

## 2019-12-04 DIAGNOSIS — R7303 Prediabetes: Secondary | ICD-10-CM

## 2019-12-04 LAB — COMPREHENSIVE METABOLIC PANEL
ALT: 16 U/L (ref 0–35)
AST: 20 U/L (ref 0–37)
Albumin: 4.3 g/dL (ref 3.5–5.2)
Alkaline Phosphatase: 68 U/L (ref 39–117)
BUN: 25 mg/dL — ABNORMAL HIGH (ref 6–23)
CO2: 27 mEq/L (ref 19–32)
Calcium: 9.3 mg/dL (ref 8.4–10.5)
Chloride: 104 mEq/L (ref 96–112)
Creatinine, Ser: 0.71 mg/dL (ref 0.40–1.20)
GFR: 79.86 mL/min (ref >60.00)
Glucose, Bld: 89 mg/dL (ref 70–99)
Potassium: 4.6 mEq/L (ref 3.5–5.1)
Sodium: 138 mEq/L (ref 135–145)
Total Bilirubin: 0.3 mg/dL (ref 0.2–1.2)
Total Protein: 6.8 g/dL (ref 6.0–8.3)

## 2019-12-04 LAB — LIPID PANEL
Cholesterol: 212 mg/dL — ABNORMAL HIGH (ref 0–200)
HDL: 74 mg/dL (ref >39.00)
LDL Cholesterol: 121 mg/dL — ABNORMAL HIGH (ref 0–99)
NonHDL: 137.98
Total CHOL/HDL Ratio: 3
Triglycerides: 85 mg/dL (ref 0.0–149.0)
VLDL: 17 mg/dL (ref 0.0–40.0)

## 2019-12-04 LAB — CBC
HCT: 41.8 % (ref 36.0–46.0)
Hemoglobin: 14 g/dL (ref 12.0–15.0)
MCHC: 33.5 g/dL (ref 30.0–36.0)
MCV: 98.3 fl (ref 78.0–100.0)
Platelets: 234 10*3/uL (ref 150.0–400.0)
RBC: 4.25 Mil/uL (ref 3.87–5.11)
RDW: 13.5 % (ref 11.5–15.5)
WBC: 6.1 10*3/uL (ref 4.0–10.5)

## 2019-12-04 LAB — HEMOGLOBIN A1C: Hgb A1c MFr Bld: 5.9 % (ref 4.6–6.5)

## 2019-12-05 LAB — HEPATITIS C ANTIBODY
Hepatitis C Ab: NONREACTIVE
SIGNAL TO CUT-OFF: 0 (ref <1.00)

## 2019-12-11 ENCOUNTER — Encounter: Payer: Self-pay | Admitting: Primary Care

## 2019-12-11 ENCOUNTER — Ambulatory Visit (INDEPENDENT_AMBULATORY_CARE_PROVIDER_SITE_OTHER): Payer: Medicare Other | Admitting: Primary Care

## 2019-12-11 ENCOUNTER — Other Ambulatory Visit: Payer: Self-pay

## 2019-12-11 VITALS — BP 124/74 | HR 70 | Temp 96.4°F | Ht 58.5 in | Wt 108.8 lb

## 2019-12-11 DIAGNOSIS — Z1231 Encounter for screening mammogram for malignant neoplasm of breast: Secondary | ICD-10-CM

## 2019-12-11 DIAGNOSIS — M816 Localized osteoporosis [Lequesne]: Secondary | ICD-10-CM

## 2019-12-11 DIAGNOSIS — Z0000 Encounter for general adult medical examination without abnormal findings: Secondary | ICD-10-CM | POA: Diagnosis not present

## 2019-12-11 DIAGNOSIS — E2839 Other primary ovarian failure: Secondary | ICD-10-CM | POA: Diagnosis not present

## 2019-12-11 DIAGNOSIS — E785 Hyperlipidemia, unspecified: Secondary | ICD-10-CM

## 2019-12-11 DIAGNOSIS — R7303 Prediabetes: Secondary | ICD-10-CM

## 2019-12-11 DIAGNOSIS — T148XXA Other injury of unspecified body region, initial encounter: Secondary | ICD-10-CM | POA: Insufficient documentation

## 2019-12-11 MED ORDER — MUPIROCIN CALCIUM 2 % EX CREA: 1.0000 "application " | TOPICAL_CREAM | Freq: Two times a day (BID) | CUTANEOUS | 1 refills | Status: AC

## 2019-12-11 NOTE — Patient Instructions (Signed)
Continue exercising. You should be getting 150 minutes of moderate intensity exercise weekly.  Continue to eat a healthy diet. Ensure you are consuming 64 ounces of water daily.  Call the breast center to schedule your mammogram and bone density scan.  It was a pleasure to see you today!

## 2019-12-11 NOTE — Assessment & Plan Note (Signed)
I have personally reviewed and have noted: 1. The patient's medical and social history 2. Their use of alcohol, tobacco or illicit drugs 3. Their current medications and supplements 4. The patient's functional ability including ADL's, fall risks, home safety risks and  hearing or visual impairment. 5. Diet and physical activities 6. Evidence for depression or mood disorder  Immunizations UTD. Mammogram and bone density due, ordered and pending. Colon cancer screening N/A due to age. Commended her on regular activity and a healthy diet. Discussed to start calcium and vitamin D. Exam today benign. Labs reviewed. All recommendations provided today.

## 2019-12-11 NOTE — Progress Notes (Signed)
Patient ID: Janet Burgess, female   DOB: 1940/08/08, 79 y.o.   MRN: [id number]  Ms. Gladman is a 79 year old female who presents today for MWV and follow up of chronic conditions.   HPI:  Past Medical History:  Diagnosis Date  . Migraine     Current Outpatient Medications  Medication Sig Dispense Refill  . aspirin EC 81 MG tablet Take 81 mg by mouth daily.    . celecoxib (CELEBREX) 200 MG capsule TAKE 1 CAPSULE BY MOUTH EVERY DAY 30 capsule 2  . mupirocin cream (BACTROBAN) 2 % Apply 1 application topically 2 (two) times daily. 30 g 1   No current facility-administered medications for this visit.    No Known Allergies  Family History  Problem Relation Age of Onset  . Breast cancer Mother 34    Social History   Socioeconomic History  . Marital status: Widowed    Spouse name: Not on file  . Number of children: Not on file  . Years of education: Not on file  . Highest education level: Not on file  Occupational History  . Not on file  Tobacco Use  . Smoking status: Current Every Day Smoker    Types: Cigarettes  . Smokeless tobacco: Never Used  . Tobacco comment: 8 per day  Substance and Sexual Activity  . Alcohol use: Yes    Alcohol/week: 0.0 standard drinks    Comment: 2 to 3 a week  . Drug use: Not on file  . Sexual activity: Not on file  Other Topics Concern  . Not on file  Social History Narrative   Widowed.    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   Enjoys gardening, stained glass, wood working, spending time on her farm, traveling.   Social Determinants of Health   Financial Resource Strain:   . Difficulty of Paying Living Expenses:   Food Insecurity:   . Worried About Charity fundraiser in the Last Year:   . Arboriculturist in the Last Year:   Transportation Needs:   . Film/video editor (Medical):   Marland Kitchen Lack of Transportation (Non-Medical):   Physical Activity:   . Days of Exercise per Week:   .  Minutes of Exercise per Session:   Stress:   . Feeling of Stress :   Social Connections:   . Frequency of Communication with Friends and Family:   . Frequency of Social Gatherings with Friends and Family:   . Attends Religious Services:   . Active Member of Clubs or Organizations:   . Attends Archivist Meetings:   Marland Kitchen Marital Status:   Intimate Partner Violence:   . Fear of Current or Ex-Partner:   . Emotionally Abused:   Marland Kitchen Physically Abused:   . Sexually Abused:     Hospitiliaztions: None   Health Maintenance:    Flu: Due this season   Tetanus: Completed in 2014  Pneumovax: Completed in 2013  Prevnar: Completed in 2014  Zostavax: Completed in 2013  Bone Density: No recent imaging, no calcium and vitamin D  Colonoscopy: Never completed  Eye Doctor: Scheduled for September 2021  Dental Exam: Completes annually   Mammogram: Completed last in 2019  Hep C Screening: Negative    Providers: Alma Friendly, PCP; Dr. Lorelei Pont, Sports Medicine   I have personally reviewed and have noted: 1. The patient's medical and social history 2. Their use of alcohol, tobacco or illicit drugs  3. Their current medications and supplements 4. The patient's functional ability including ADL's, fall risks, home safety risks and  hearing or visual impairment. 5. Diet and physical activities 6. Evidence for depression or mood disorder  Subjective:   Review of Systems:   Constitutional: Denies fever, malaise, fatigue, headache or abrupt weight changes.  HEENT: Denies eye pain, eye redness, ear pain, ringing in the ears, wax buildup, runny nose, nasal congestion, bloody nose, or sore throat. Respiratory: Denies difficulty breathing, shortness of breath, cough or sputum production.   Cardiovascular: Denies chest pain, chest tightness, palpitations or swelling in the hands or feet.  Gastrointestinal: Denies abdominal pain, bloating, constipation, diarrhea or blood in the stool.  GU:  Denies urgency, frequency, pain with urination, burning sensation, blood in urine, odor or discharge. Musculoskeletal: Denies decrease in range of motion, difficulty with gait.  Skin: Denies redness, rashes, lesions or ulcercations.  Neurological: Denies dizziness, difficulty with memory, difficulty with speech or problems with balance and coordination.  Psychiatric: Denies concerns for anxiety or depression.   No other specific complaints in a complete review of systems (except as listed in HPI above).  Objective:  PE:   Temp (!) 96.4 F (35.8 C) (Temporal)   Ht 4' 10.5" (1.486 m)   Wt 108 lb 12 oz (49.3 kg)   BMI 22.34 kg/m  Wt Readings from Last 3 Encounters:  12/11/19 108 lb 12 oz (49.3 kg)  08/14/19 109 lb 12 oz (49.8 kg)  07/21/17 109 lb 8 oz (49.7 kg)    General: Appears their stated age, well developed, well nourished in NAD. Skin: Warm, dry and intact. No rashes, lesions or ulcerations noted. HEENT: Head: normal shape and size; Eyes: sclera white, no icterus, conjunctiva pink, PERRLA and EOMs intact; Ears: Tm's gray and intact, normal light reflex; Nose: mucosa pink and moist, septum midline; Throat/Mouth: Teeth present, mucosa pink and moist, no exudate, lesions or ulcerations noted.  Neck: Normal range of motion. Neck supple, trachea midline. No massses, lumps or thyromegaly present.  Cardiovascular: Normal rate and rhythm. S1,S2 noted.  No murmur, rubs or gallops noted. No JVD or BLE edema. No carotid bruits noted. Pulmonary/Chest: Normal effort and positive vesicular breath sounds. No respiratory distress. No wheezes, rales or ronchi noted.  Abdomen: Soft and nontender. Normal bowel sounds, no bruits noted. No distention or masses noted. Liver, spleen and kidneys non palpable. Musculoskeletal: Normal range of motion. No signs of joint swelling. No difficulty with gait.  Neurological: Alert and oriented. Cranial nerves II-XII intact. Coordination normal. +DTRs  bilaterally. Psychiatric: Mood and affect normal. Behavior is normal. Judgment and thought content normal.    BMET    Component Value Date/Time   NA 138 12/04/2019 0804   K 4.6 12/04/2019 0804   CL 104 12/04/2019 0804   CO2 27 12/04/2019 0804   GLUCOSE 89 12/04/2019 0804   BUN 25 (H) 12/04/2019 0804   CREATININE 0.71 12/04/2019 0804   CALCIUM 9.3 12/04/2019 0804    Lipid Panel     Component Value Date/Time   CHOL 212 (H) 12/04/2019 0804   TRIG 85.0 12/04/2019 0804   HDL 74.00 12/04/2019 0804   CHOLHDL 3 12/04/2019 0804   VLDL 17.0 12/04/2019 0804   LDLCALC 121 (H) 12/04/2019 0804    CBC    Component Value Date/Time   WBC 6.1 12/04/2019 0804   RBC 4.25 12/04/2019 0804   HGB 14.0 12/04/2019 0804   HCT 41.8 12/04/2019 0804   PLT 234.0 12/04/2019 0804  MCV 98.3 12/04/2019 0804   MCHC 33.5 12/04/2019 0804   RDW 13.5 12/04/2019 0804   LYMPHSABS 2.7 05/25/2016 0826   MONOABS 0.7 05/25/2016 0826   EOSABS 0.1 05/25/2016 0826   BASOSABS 0.0 05/25/2016 0826    Hgb A1C Lab Results  Component Value Date   HGBA1C 5.9 12/04/2019      Assessment and Plan:   Medicare Annual Wellness Visit:  Diet: She endorses a healthy diet.  Physical activity: Active, walks daily, water aerobics.  Depression/mood screen: Negative Hearing: Intact to whispered voice Visual acuity: Grossly normal, performs annual eye exam  ADLs: Capable Fall risk: None Home safety: Good Cognitive evaluation: Intact to orientation, naming, recall and repetition EOL planning: Adv directives, full code/ I agree  Preventative Medicine: Immunizations UTD. Mammogram and bone density due, ordered and pending. Colon cancer screening N/A due to age. Commended her on regular activity and a healthy diet. Discussed to start calcium and vitamin D. Exam today benign. Labs reviewed. All recommendations provided today.   Next appointment: One year

## 2019-12-11 NOTE — Assessment & Plan Note (Signed)
Stable.       - Continue to monitor

## 2019-12-11 NOTE — Assessment & Plan Note (Signed)
Stable at 5.9 which has been the case for years. Encouraged regular exercise, healthy diet.

## 2019-12-11 NOTE — Assessment & Plan Note (Signed)
Not taking calcium and vitamin D, discussed to start. Repeat bone density scan pending. Continue weight bearing exercise.

## 2019-12-11 NOTE — Assessment & Plan Note (Signed)
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

## 2020-02-24 ENCOUNTER — Other Ambulatory Visit: Payer: Self-pay | Admitting: Family Medicine

## 2020-02-24 DIAGNOSIS — M19079 Primary osteoarthritis, unspecified ankle and foot: Secondary | ICD-10-CM

## 2020-02-25 NOTE — Telephone Encounter (Signed)
I'm okay to refill, but I'd like to know how often she's having to take this. It looks like she had about 3 months worth total. Can you find out some additional information in general?

## 2020-02-25 NOTE — Telephone Encounter (Signed)
Not given by you in the past ok to refill?

## 2020-02-28 NOTE — Telephone Encounter (Signed)
Was given for foot pain. Taking daily for pain and swelling.

## 2020-02-28 NOTE — Telephone Encounter (Signed)
Refills sent to pharmacy. 

## 2020-03-09 DIAGNOSIS — Z23 Encounter for immunization: Secondary | ICD-10-CM | POA: Diagnosis not present

## 2020-04-07 DIAGNOSIS — L819 Disorder of pigmentation, unspecified: Secondary | ICD-10-CM | POA: Diagnosis not present

## 2020-04-07 DIAGNOSIS — L570 Actinic keratosis: Secondary | ICD-10-CM | POA: Diagnosis not present

## 2020-04-07 DIAGNOSIS — Z85820 Personal history of malignant melanoma of skin: Secondary | ICD-10-CM | POA: Diagnosis not present

## 2020-04-07 DIAGNOSIS — L905 Scar conditions and fibrosis of skin: Secondary | ICD-10-CM | POA: Diagnosis not present

## 2020-07-27 DIAGNOSIS — L819 Disorder of pigmentation, unspecified: Secondary | ICD-10-CM | POA: Diagnosis not present

## 2020-07-27 DIAGNOSIS — L814 Other melanin hyperpigmentation: Secondary | ICD-10-CM | POA: Diagnosis not present

## 2020-07-27 DIAGNOSIS — L821 Other seborrheic keratosis: Secondary | ICD-10-CM | POA: Diagnosis not present

## 2020-07-27 DIAGNOSIS — D1801 Hemangioma of skin and subcutaneous tissue: Secondary | ICD-10-CM | POA: Diagnosis not present

## 2020-07-27 DIAGNOSIS — I8393 Asymptomatic varicose veins of bilateral lower extremities: Secondary | ICD-10-CM | POA: Diagnosis not present

## 2020-07-27 DIAGNOSIS — Z85828 Personal history of other malignant neoplasm of skin: Secondary | ICD-10-CM | POA: Diagnosis not present

## 2020-07-27 DIAGNOSIS — L905 Scar conditions and fibrosis of skin: Secondary | ICD-10-CM | POA: Diagnosis not present

## 2021-05-21 IMAGING — DX DG ANKLE COMPLETE 3+V*R*
3 series · 4 of 4 positions shown · non-contrast
Comparison: None.

CLINICAL DATA: Ankle pain

EXAM:
RIGHT ANKLE - COMPLETE 3+ VIEW

[ankle ap]
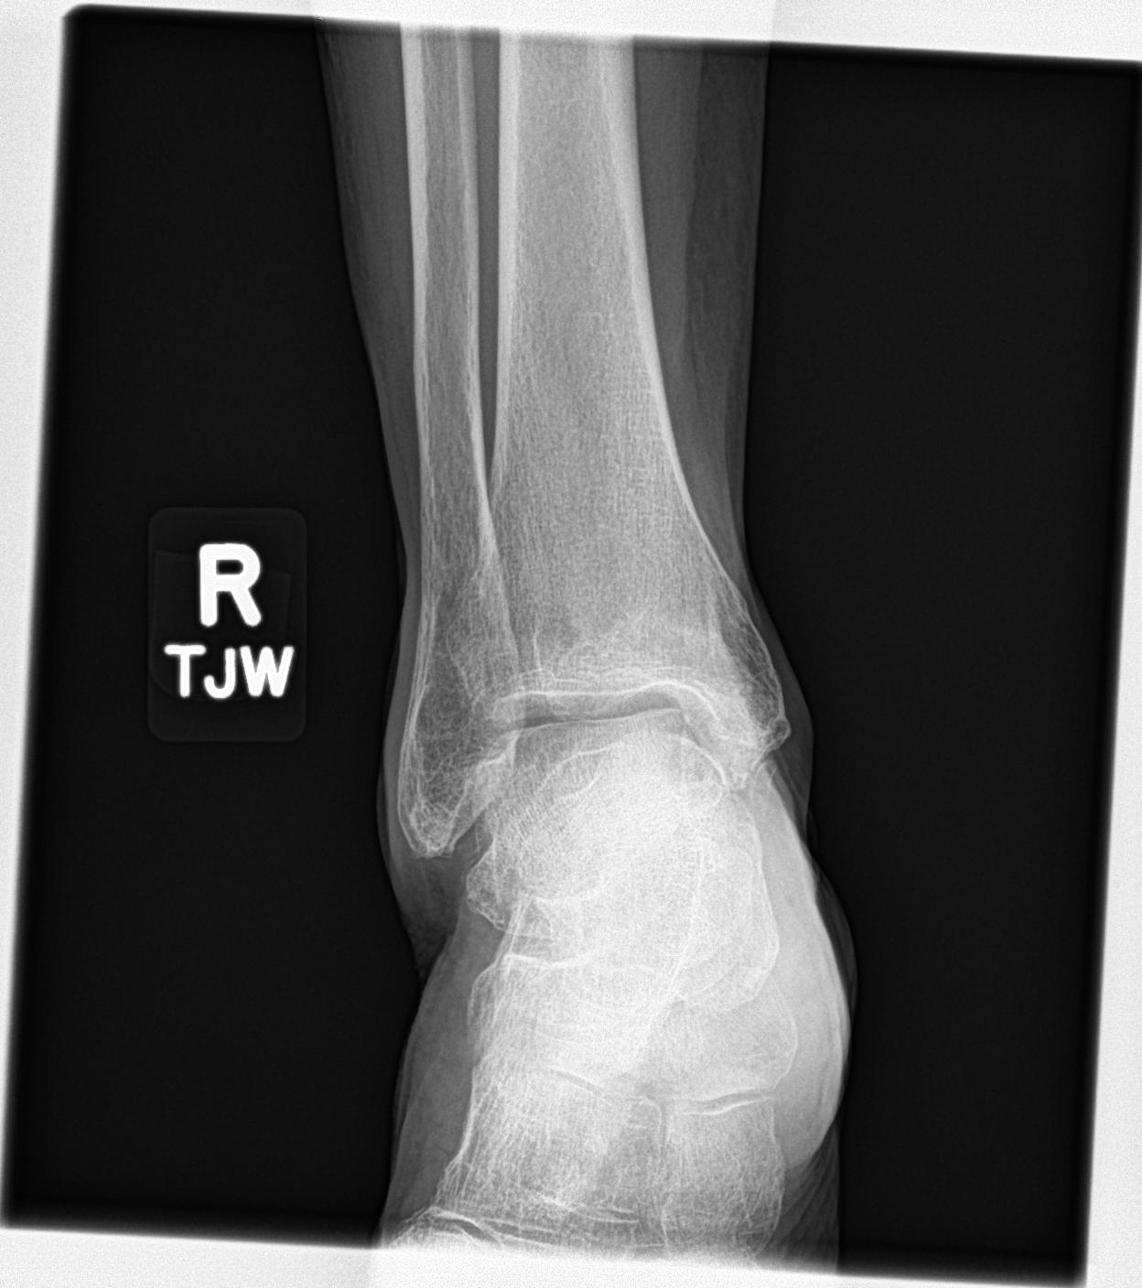

[Series 3: ankle mortise · 0.14mm/px · 2 of 2 slices shown]
[im 1/2]
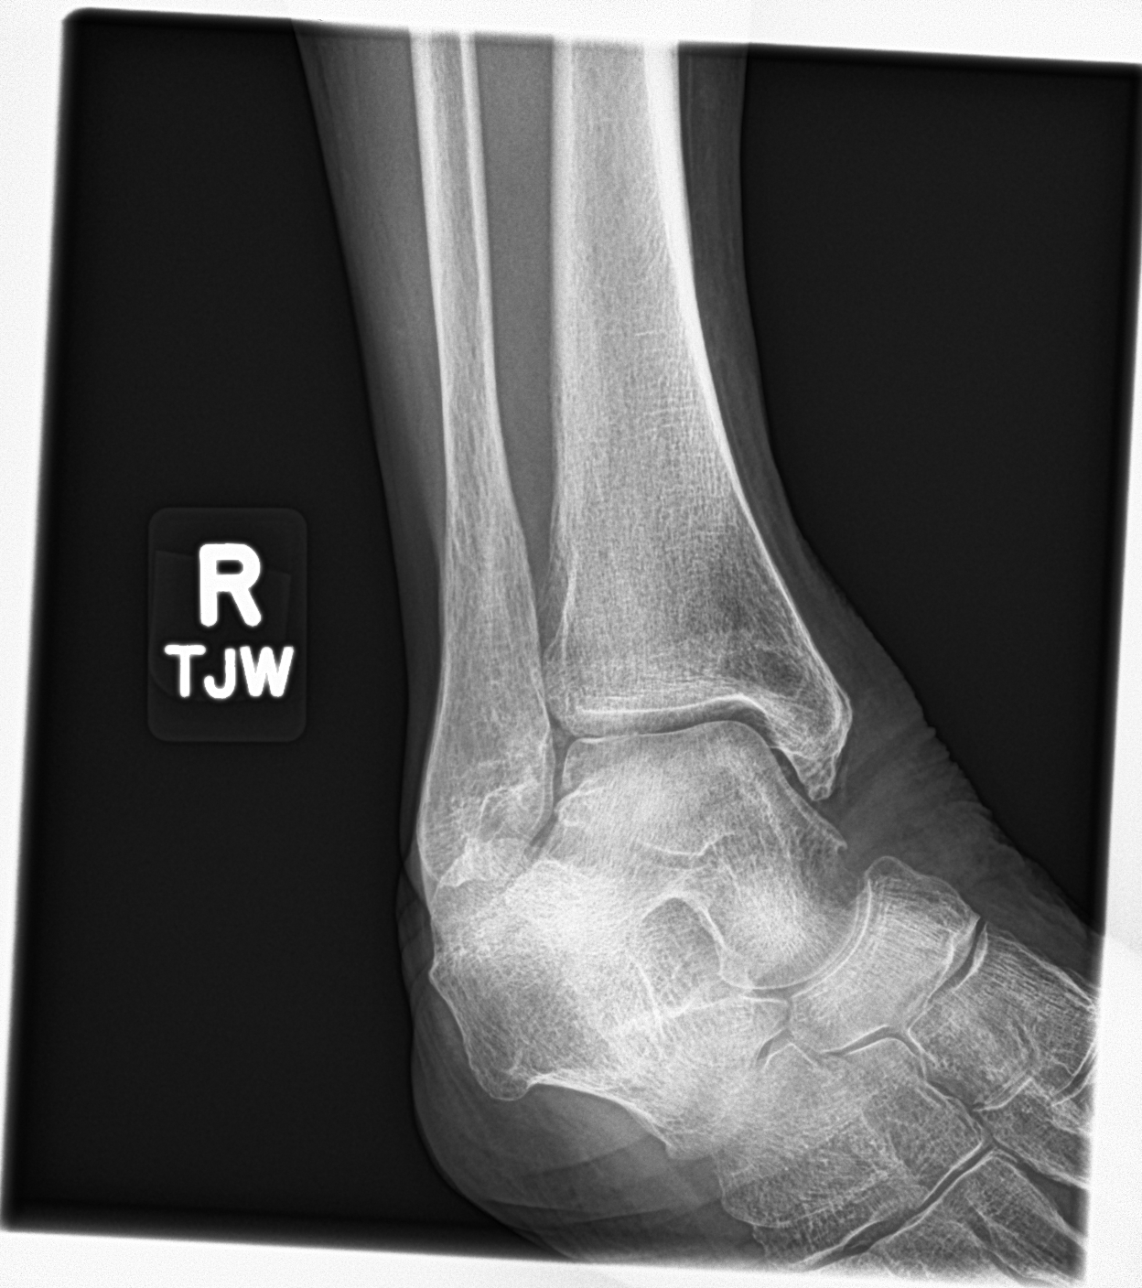
[im 2/2]
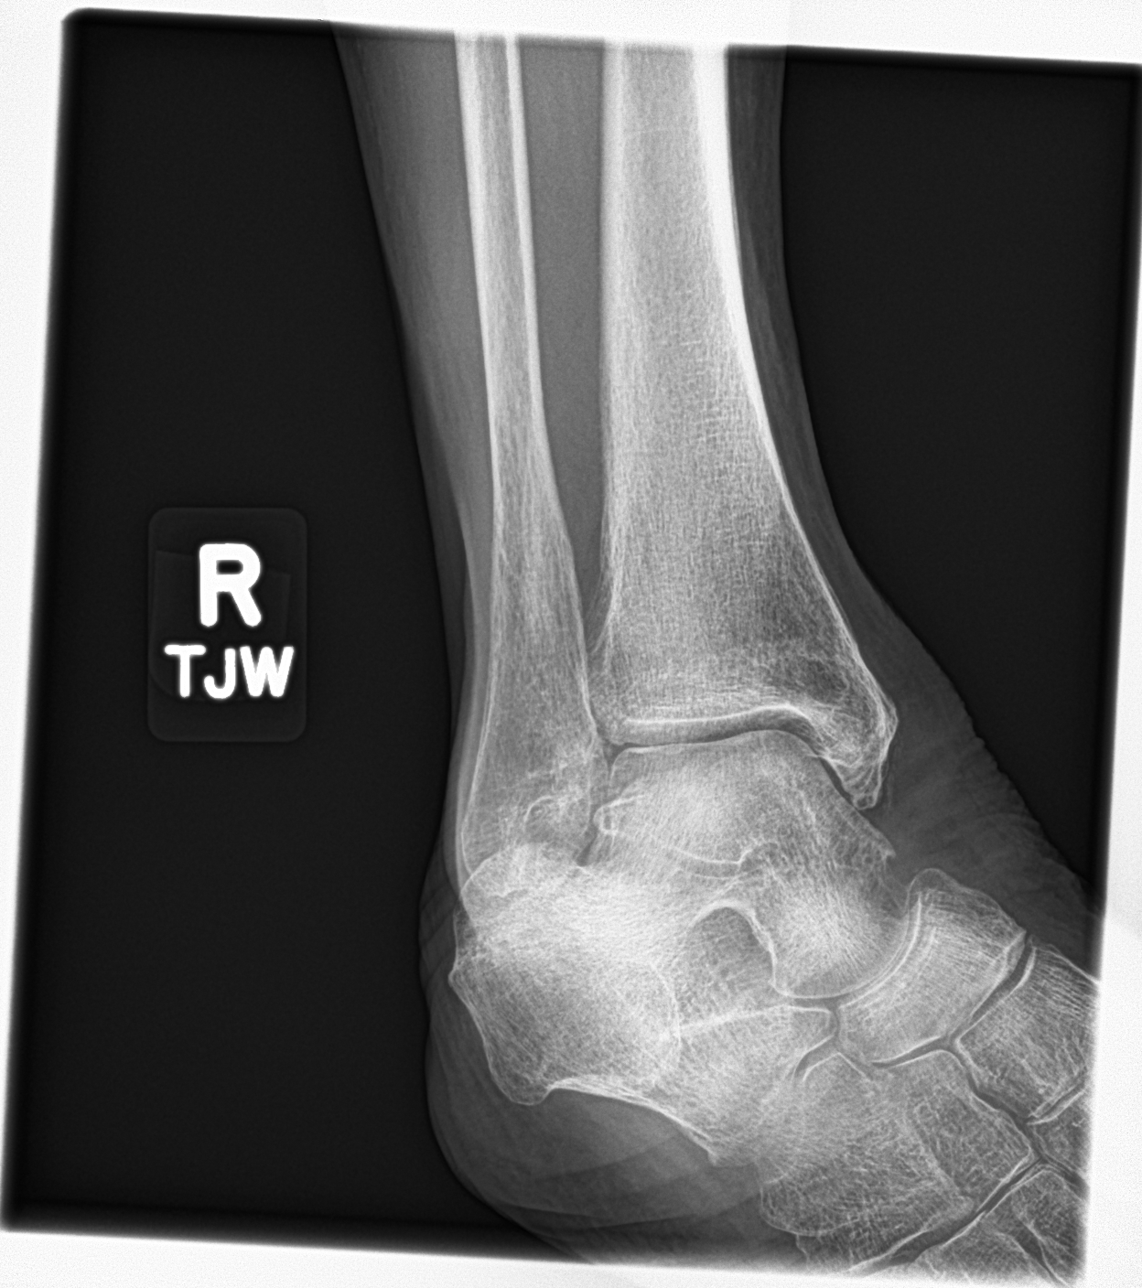

[ankle lat]
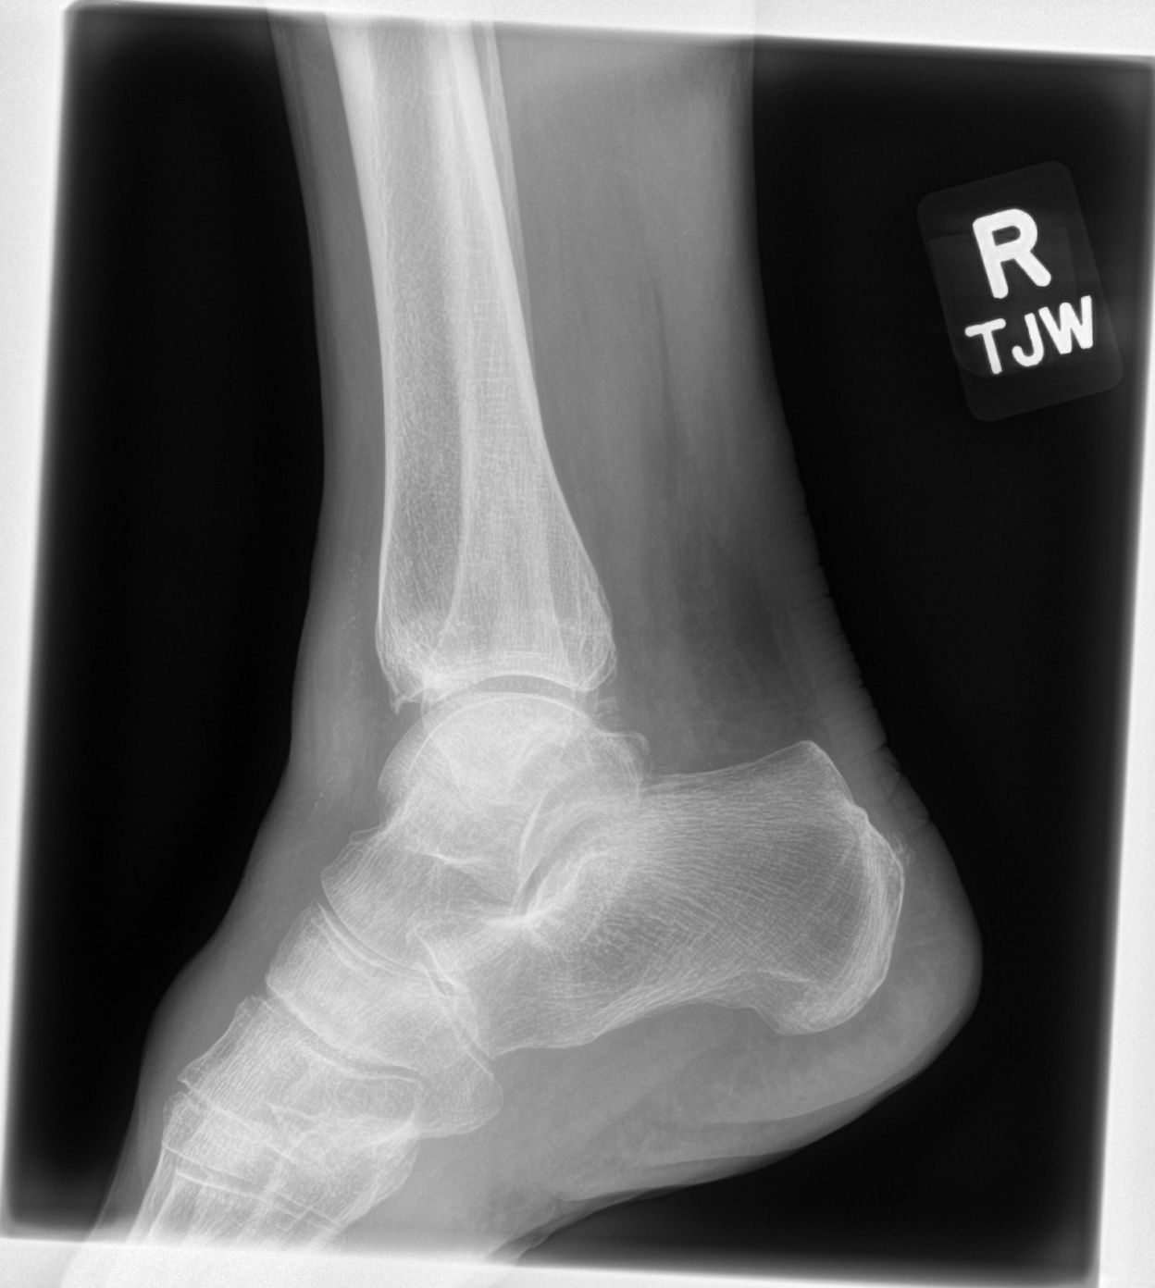

[4 of 4 positions shown; findings below may reference images not displayed]

FINDINGS: There are mild-to-moderate degenerative changes of the ankle
mortise. There is no acute displaced fracture or dislocation. There
is no surrounding soft tissue swelling. Vascular calcifications are
noted.
IMPRESSION: No acute displaced fracture or dislocation. Mild-to-moderate
degenerative changes of the ankle mortise.

## 2021-07-27 DIAGNOSIS — L814 Other melanin hyperpigmentation: Secondary | ICD-10-CM | POA: Diagnosis not present

## 2021-07-27 DIAGNOSIS — L812 Freckles: Secondary | ICD-10-CM | POA: Diagnosis not present

## 2021-07-27 DIAGNOSIS — L570 Actinic keratosis: Secondary | ICD-10-CM | POA: Diagnosis not present

## 2021-07-27 DIAGNOSIS — Z85828 Personal history of other malignant neoplasm of skin: Secondary | ICD-10-CM | POA: Diagnosis not present

## 2021-07-27 DIAGNOSIS — D225 Melanocytic nevi of trunk: Secondary | ICD-10-CM | POA: Diagnosis not present

## 2021-07-27 DIAGNOSIS — L821 Other seborrheic keratosis: Secondary | ICD-10-CM | POA: Diagnosis not present

## 2021-07-27 DIAGNOSIS — Z85820 Personal history of malignant melanoma of skin: Secondary | ICD-10-CM | POA: Diagnosis not present

## 2021-07-27 DIAGNOSIS — Z08 Encounter for follow-up examination after completed treatment for malignant neoplasm: Secondary | ICD-10-CM | POA: Diagnosis not present

## 2021-07-27 DIAGNOSIS — L819 Disorder of pigmentation, unspecified: Secondary | ICD-10-CM | POA: Diagnosis not present

## 2021-10-20 ENCOUNTER — Encounter: Payer: Self-pay | Admitting: Primary Care

## 2021-10-20 ENCOUNTER — Ambulatory Visit (INDEPENDENT_AMBULATORY_CARE_PROVIDER_SITE_OTHER): Payer: Medicare Other | Admitting: Primary Care

## 2021-10-20 VITALS — BP 108/68 | HR 69 | Temp 98.0°F | Ht 58.5 in | Wt 110.0 lb

## 2021-10-20 DIAGNOSIS — R7303 Prediabetes: Secondary | ICD-10-CM

## 2021-10-20 DIAGNOSIS — E785 Hyperlipidemia, unspecified: Secondary | ICD-10-CM | POA: Diagnosis not present

## 2021-10-20 DIAGNOSIS — M816 Localized osteoporosis [Lequesne]: Secondary | ICD-10-CM | POA: Diagnosis not present

## 2021-10-20 LAB — CBC
HCT: 43.9 % (ref 36.0–46.0)
Hemoglobin: 14.5 g/dL (ref 12.0–15.0)
MCHC: 33.1 g/dL (ref 30.0–36.0)
MCV: 97.8 fl (ref 78.0–100.0)
Platelets: 238 10*3/uL (ref 150.0–400.0)
RBC: 4.48 Mil/uL (ref 3.87–5.11)
RDW: 14 % (ref 11.5–15.5)
WBC: 5.9 10*3/uL (ref 4.0–10.5)

## 2021-10-20 LAB — COMPREHENSIVE METABOLIC PANEL
ALT: 13 U/L (ref 0–35)
AST: 19 U/L (ref 0–37)
Albumin: 4.4 g/dL (ref 3.5–5.2)
Alkaline Phosphatase: 76 U/L (ref 39–117)
BUN: 15 mg/dL (ref 6–23)
CO2: 28 mEq/L (ref 19–32)
Calcium: 9.6 mg/dL (ref 8.4–10.5)
Chloride: 104 mEq/L (ref 96–112)
Creatinine, Ser: 0.64 mg/dL (ref 0.40–1.20)
GFR: 84.48 mL/min (ref >60.00)
Glucose, Bld: 95 mg/dL (ref 70–99)
Potassium: 4.3 mEq/L (ref 3.5–5.1)
Sodium: 140 mEq/L (ref 135–145)
Total Bilirubin: 0.4 mg/dL (ref 0.2–1.2)
Total Protein: 7 g/dL (ref 6.0–8.3)

## 2021-10-20 LAB — LIPID PANEL
Cholesterol: 226 mg/dL — ABNORMAL HIGH (ref 0–200)
HDL: 79.8 mg/dL (ref >39.00)
LDL Cholesterol: 131 mg/dL — ABNORMAL HIGH (ref 0–99)
NonHDL: 146.17
Total CHOL/HDL Ratio: 3
Triglycerides: 74 mg/dL (ref 0.0–149.0)
VLDL: 14.8 mg/dL (ref 0.0–40.0)

## 2021-10-20 LAB — HEMOGLOBIN A1C: Hgb A1c MFr Bld: 5.9 % (ref 4.6–6.5)

## 2021-10-20 NOTE — Assessment & Plan Note (Signed)
Repeat A1C pending.  Commended her on regular exercise.

## 2021-10-20 NOTE — Progress Notes (Signed)
Subjective:    Patient ID: Janet Burgess, female    DOB: [birth year], 81 y.o.   MRN: [id number]  HPI  Janet Burgess is a very pleasant 81 y.o. female with a history of hyperlipidemia, osteoporosis, prediabetes, tobacco abuse who presents today for follow up of chronic conditions.  for complete physical and follow up of chronic conditions.  1) Osteoporosis: Chronic. Last bone density scan was in 2018 with T-score of -3.0. Today she endorses compliance to calcium and vitamin D. She is active everyday. She is due for repeat bone density scan but does not wish to proceed as she isn't interested in prescription treatment.   2) Hyperlipidemia: Chronic, currently not on medication. She endorses a healthy diet and is active daily. She denies chest pain, SOB, headaches.   BP Readings from Last 3 Encounters:  10/20/21 108/68  12/11/19 124/74  08/14/19 138/88     Immunizations: -Tetanus: 2014 -Influenza: did not complete last season -Covid-19: 3 vaccines -Shingles: Completed Zostavax and 1 dose of Shingrix, has not completed part 2.  -Pneumonia: Prevnar 13 in 2014, Pneumovax in 2013  Mammogram: Completed in 2019, declines given age Colonoscopy: Never completed, N/A given age. Dexa: Completed in 2018, declines today  BP Readings from Last 3 Encounters:  10/20/21 108/68  12/11/19 124/74  08/14/19 138/88       Review of Systems  HENT:  Negative for rhinorrhea.   Respiratory:  Negative for shortness of breath.   Cardiovascular:  Negative for chest pain.  Gastrointestinal:  Negative for constipation and diarrhea.  Musculoskeletal:  Negative for arthralgias and myalgias.  Skin:  Negative for rash.  Allergic/Immunologic: Negative for environmental allergies.  Neurological:  Negative for dizziness and headaches.        Past Medical History:  Diagnosis Date   Migraine     Social History   Socioeconomic History   Marital status: Widowed    Spouse name: Not on file    Number of children: Not on file   Years of education: Not on file   Highest education level: Not on file  Occupational History   Not on file  Tobacco Use   Smoking status: Every Day    Types: Cigarettes   Smokeless tobacco: Never   Tobacco comments:    8 per day  Substance and Sexual Activity   Alcohol use: Yes    Alcohol/week: 0.0 standard drinks    Comment: 2 to 3 a week   Drug use: Not on file   Sexual activity: Not on file  Other Topics Concern   Not on file  Social History Narrative   Widowed.    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   Enjoys gardening, stained glass, wood working, spending time on her farm, traveling.   Social Determinants of Health   Financial Resource Strain: Not on file  Food Insecurity: Not on file  Transportation Needs: Not on file  Physical Activity: Not on file  Stress: Not on file  Social Connections: Not on file  Intimate Partner Violence: Not on file    Past Surgical History:  Procedure Laterality Date   ABDOMINAL HYSTERECTOMY  1974-1975   BACK SURGERY  [phone]   4 times   CATARACT EXTRACTION, BILATERAL Bilateral 2014    Family History  Problem Relation Age of Onset   Breast cancer Mother 76    No Known Allergies  Current Outpatient Medications on File Prior to Visit  Medication Sig Dispense  Refill   aspirin EC 81 MG tablet Take 81 mg by mouth daily.     mupirocin cream (BACTROBAN) 2 % Apply 1 application topically 2 (two) times daily. 30 g 1   No current facility-administered medications on file prior to visit.    BP 108/68   Pulse 69   Temp 98 F (36.7 C) (Oral)   Ht 4' 10.5" (1.486 m)   Wt 110 lb (49.9 kg)   SpO2 97%   BMI 22.60 kg/m  Objective:   Physical Exam Constitutional:      General: She is not in acute distress. Cardiovascular:     Rate and Rhythm: Normal rate and regular rhythm.  Pulmonary:     Effort: Pulmonary effort is normal.     Breath sounds:  Normal breath sounds.  Abdominal:     General: Bowel sounds are normal.     Tenderness: There is no abdominal tenderness.  Musculoskeletal:     Cervical back: Neck supple.  Skin:    General: Skin is warm and dry.  Neurological:     Mental Status: She is alert.          Assessment & Plan:

## 2021-10-20 NOTE — Assessment & Plan Note (Signed)
Continue calcium and vitamin D. Encouraged weight bearing exercise.  Recommended repeat bone density scan, she kindly declines despite recommendations.

## 2021-10-20 NOTE — Patient Instructions (Signed)
Continue calcium and vitamin D daily for bone health. Continue regular activity.  Stop by the lab prior to leaving today. I will notify you of your results once received.   It was a pleasure to see you today!

## 2021-10-20 NOTE — Assessment & Plan Note (Signed)
Commended her on regular activity, encouraged to continue. Repeat lipid panel pending.

## 2021-11-26 ENCOUNTER — Telehealth: Payer: Self-pay | Admitting: Primary Care

## 2021-11-26 NOTE — Telephone Encounter (Signed)
Patient declined the Medicare Wellness Visit with [facility]   Stated she answered all the medicare questions during her visit in May 2023 with provider

## 2021-12-21 DIAGNOSIS — M65331 Trigger finger, right middle finger: Secondary | ICD-10-CM | POA: Diagnosis not present

## 2021-12-21 DIAGNOSIS — M65341 Trigger finger, right ring finger: Secondary | ICD-10-CM | POA: Diagnosis not present

## 2022-01-24 DIAGNOSIS — Z85820 Personal history of malignant melanoma of skin: Secondary | ICD-10-CM | POA: Diagnosis not present

## 2022-01-24 DIAGNOSIS — L814 Other melanin hyperpigmentation: Secondary | ICD-10-CM | POA: Diagnosis not present

## 2022-01-24 DIAGNOSIS — Z08 Encounter for follow-up examination after completed treatment for malignant neoplasm: Secondary | ICD-10-CM | POA: Diagnosis not present

## 2022-01-24 DIAGNOSIS — Z85828 Personal history of other malignant neoplasm of skin: Secondary | ICD-10-CM | POA: Diagnosis not present

## 2022-01-24 DIAGNOSIS — D225 Melanocytic nevi of trunk: Secondary | ICD-10-CM | POA: Diagnosis not present

## 2022-01-24 DIAGNOSIS — L821 Other seborrheic keratosis: Secondary | ICD-10-CM | POA: Diagnosis not present

## 2022-04-26 DIAGNOSIS — M65331 Trigger finger, right middle finger: Secondary | ICD-10-CM | POA: Diagnosis not present

## 2022-04-26 DIAGNOSIS — M65341 Trigger finger, right ring finger: Secondary | ICD-10-CM | POA: Diagnosis not present

## 2022-06-02 DIAGNOSIS — Z23 Encounter for immunization: Secondary | ICD-10-CM | POA: Diagnosis not present

## 2022-07-28 DIAGNOSIS — Z85820 Personal history of malignant melanoma of skin: Secondary | ICD-10-CM | POA: Diagnosis not present

## 2022-07-28 DIAGNOSIS — L570 Actinic keratosis: Secondary | ICD-10-CM | POA: Diagnosis not present

## 2022-07-28 DIAGNOSIS — Z85828 Personal history of other malignant neoplasm of skin: Secondary | ICD-10-CM | POA: Diagnosis not present

## 2022-07-28 DIAGNOSIS — L814 Other melanin hyperpigmentation: Secondary | ICD-10-CM | POA: Diagnosis not present

## 2022-07-28 DIAGNOSIS — L821 Other seborrheic keratosis: Secondary | ICD-10-CM | POA: Diagnosis not present

## 2022-07-28 DIAGNOSIS — D225 Melanocytic nevi of trunk: Secondary | ICD-10-CM | POA: Diagnosis not present

## 2022-07-28 DIAGNOSIS — Z08 Encounter for follow-up examination after completed treatment for malignant neoplasm: Secondary | ICD-10-CM | POA: Diagnosis not present

## 2023-02-02 DIAGNOSIS — Z85820 Personal history of malignant melanoma of skin: Secondary | ICD-10-CM | POA: Diagnosis not present

## 2023-02-02 DIAGNOSIS — L570 Actinic keratosis: Secondary | ICD-10-CM | POA: Diagnosis not present

## 2023-02-02 DIAGNOSIS — L821 Other seborrheic keratosis: Secondary | ICD-10-CM | POA: Diagnosis not present

## 2023-02-02 DIAGNOSIS — L814 Other melanin hyperpigmentation: Secondary | ICD-10-CM | POA: Diagnosis not present

## 2023-02-02 DIAGNOSIS — Z08 Encounter for follow-up examination after completed treatment for malignant neoplasm: Secondary | ICD-10-CM | POA: Diagnosis not present

## 2023-02-02 DIAGNOSIS — L578 Other skin changes due to chronic exposure to nonionizing radiation: Secondary | ICD-10-CM | POA: Diagnosis not present

## 2023-02-02 DIAGNOSIS — D225 Melanocytic nevi of trunk: Secondary | ICD-10-CM | POA: Diagnosis not present

## 2023-02-02 DIAGNOSIS — Z85828 Personal history of other malignant neoplasm of skin: Secondary | ICD-10-CM | POA: Diagnosis not present

## 2023-03-16 DIAGNOSIS — Z23 Encounter for immunization: Secondary | ICD-10-CM | POA: Diagnosis not present

## 2023-04-11 DIAGNOSIS — M65331 Trigger finger, right middle finger: Secondary | ICD-10-CM | POA: Diagnosis not present

## 2023-04-11 DIAGNOSIS — M65341 Trigger finger, right ring finger: Secondary | ICD-10-CM | POA: Diagnosis not present

## 2023-04-13 ENCOUNTER — Encounter: Payer: Self-pay | Admitting: Primary Care

## 2023-04-13 ENCOUNTER — Ambulatory Visit (INDEPENDENT_AMBULATORY_CARE_PROVIDER_SITE_OTHER): Payer: Medicare Other | Admitting: Primary Care

## 2023-04-13 VITALS — BP 144/82 | HR 67 | Temp 97.2°F | Ht 58.5 in | Wt 113.0 lb

## 2023-04-13 DIAGNOSIS — Z23 Encounter for immunization: Secondary | ICD-10-CM | POA: Diagnosis not present

## 2023-04-13 DIAGNOSIS — R7303 Prediabetes: Secondary | ICD-10-CM | POA: Diagnosis not present

## 2023-04-13 DIAGNOSIS — E785 Hyperlipidemia, unspecified: Secondary | ICD-10-CM

## 2023-04-13 DIAGNOSIS — Z1231 Encounter for screening mammogram for malignant neoplasm of breast: Secondary | ICD-10-CM

## 2023-04-13 DIAGNOSIS — M816 Localized osteoporosis [Lequesne]: Secondary | ICD-10-CM | POA: Diagnosis not present

## 2023-04-13 LAB — COMPREHENSIVE METABOLIC PANEL
ALT: 15 U/L (ref 0–35)
AST: 18 U/L (ref 0–37)
Albumin: 4.4 g/dL (ref 3.5–5.2)
Alkaline Phosphatase: 89 U/L (ref 39–117)
BUN: 16 mg/dL (ref 6–23)
CO2: 29 meq/L (ref 19–32)
Calcium: 10.1 mg/dL (ref 8.4–10.5)
Chloride: 102 meq/L (ref 96–112)
Creatinine, Ser: 0.72 mg/dL (ref 0.40–1.20)
GFR: 79.1 mL/min (ref >60.00)
Glucose, Bld: 95 mg/dL (ref 70–99)
Potassium: 4.3 meq/L (ref 3.5–5.1)
Sodium: 138 meq/L (ref 135–145)
Total Bilirubin: 0.4 mg/dL (ref 0.2–1.2)
Total Protein: 6.9 g/dL (ref 6.0–8.3)

## 2023-04-13 LAB — LIPID PANEL
Cholesterol: 238 mg/dL — ABNORMAL HIGH (ref 0–200)
HDL: 78 mg/dL (ref >39.00)
LDL Cholesterol: 140 mg/dL — ABNORMAL HIGH (ref 0–99)
NonHDL: 160.46
Total CHOL/HDL Ratio: 3
Triglycerides: 103 mg/dL (ref 0.0–149.0)
VLDL: 20.6 mg/dL (ref 0.0–40.0)

## 2023-04-13 LAB — HEMOGLOBIN A1C: Hgb A1c MFr Bld: 6 % (ref 4.6–6.5)

## 2023-04-13 NOTE — Assessment & Plan Note (Signed)
Repeat A1c pending. 

## 2023-04-13 NOTE — Assessment & Plan Note (Signed)
Reviewed bone density scan result from March 2018 with patient. Recommended repeat bone density scan, she declines.  Continue calcium and vitamin D and weightbearing exercise.

## 2023-04-13 NOTE — Assessment & Plan Note (Addendum)
Repeat lipid panel pending.  Continue healthy diet and regular exercise. Continue aspirin 81 mg daily.

## 2023-04-13 NOTE — Progress Notes (Signed)
Subjective:    Patient ID: Janet Burgess, female    DOB: July 28, 1940, 82 y.o.   MRN: [id number]  HPI  Koko Rosencrantz is a very pleasant 82 y.o. female with a history of osteoporosis, hyperlipidemia, prediabetes, tobacco use who presents today for follow-up of chronic conditions.  Immunizations: -Tetanus: Completed in 2014 -Influenza: Completed this season  -Shingles: Completed Zostavax and 1 dose of Shingrix -Pneumonia: Completed Prevnar 13 in 2014, Pneumovax 23 in 2013  Mammogram: April 2019 Bone Density Scan: March 2018  Colonoscopy: Never completed. Lung Cancer Screening: Never completed  1) Hyperlipidemia: Chronic.  Not currently managed on treatment.  She does take aspirin 81 mg daily.  She endorses a healthy diet, is active daily with 8-10 thousand steps.   2) Prediabetes: Chronic and stable over the years with level of 5.9 for the last 6 years consecutively.  3) Osteoporosis: Last bone density scan on file is from March 2018 with a T-score of -3.0.  Dual right femur neck with -3.0 and left forearm with -2.4.  She does take calcium and vitamin D most days of the week. She is not interested in repeating her bone density scan as she "knows it's bad". She is walking daily. Remains active at home. Denies falls.   BP Readings from Last 3 Encounters:  04/13/23 (!) 144/82  10/20/21 108/68  12/11/19 124/74       Review of Systems  Respiratory:  Negative for shortness of breath.   Cardiovascular:  Negative for chest pain.  Gastrointestinal:  Negative for constipation and diarrhea.  Neurological:  Negative for headaches.  Psychiatric/Behavioral:  The patient is not nervous/anxious.          Past Medical History:  Diagnosis Date   Migraine     Social History   Socioeconomic History   Marital status: Widowed    Spouse name: Not on file   Number of children: Not on file   Years of education: Not on file   Highest education level: Master's degree (e.g., MA,  MS, MEng, MEd, MSW, MBA)  Occupational History   Not on file  Tobacco Use   Smoking status: Every Day    Types: Cigarettes   Smokeless tobacco: Never   Tobacco comments:    8 per day  Substance and Sexual Activity   Alcohol use: Yes    Alcohol/week: 0.0 standard drinks of alcohol    Comment: 2 to 3 a week   Drug use: Not on file   Sexual activity: Not on file  Other Topics Concern   Not on file  Social History Narrative   Widowed.    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   Enjoys gardening, stained glass, wood working, spending time on her farm, traveling.   Social Determinants of Health   Financial Resource Strain: Low Risk  (04/10/2023)   Overall Financial Resource Strain (CARDIA)    Difficulty of Paying Living Expenses: Not hard at all  Food Insecurity: No Food Insecurity (04/10/2023)   Hunger Vital Sign    Worried About Running Out of Food in the Last Year: Never true    Ran Out of Food in the Last Year: Never true  Transportation Needs: No Transportation Needs (04/10/2023)   PRAPARE - Administrator, Civil Service (Medical): No    Lack of Transportation (Non-Medical): No  Physical Activity: Sufficiently Active (04/10/2023)   Exercise Vital Sign    Days of Exercise per Week:  7 days    Minutes of Exercise per Session: 30 min  Stress: No Stress Concern Present (04/10/2023)   [employer] of Occupational Health - Occupational Stress Questionnaire    Feeling of Stress : Not at all  Social Connections: Unknown (04/10/2023)   Social Connection and Isolation Panel [NHANES]    Frequency of Communication with Friends and Family: More than three times a week    Frequency of Social Gatherings with Friends and Family: More than three times a week    Attends Religious Services: 1 to 4 times per year    Active Member of [employer] or Organizations: Patient declined    Attends Banker Meetings: Not on file     Marital Status: Widowed  Intimate Partner Violence: Not on file    Past Surgical History:  Procedure Laterality Date   ABDOMINAL HYSTERECTOMY  1974-1975   BACK SURGERY  [phone]   4 times   CATARACT EXTRACTION, BILATERAL Bilateral 2014    Family History  Problem Relation Age of Onset   Breast cancer Mother 7    No Known Allergies  Current Outpatient Medications on File Prior to Visit  Medication Sig Dispense Refill   aspirin EC 81 MG tablet Take 81 mg by mouth daily.     mupirocin cream (BACTROBAN) 2 % Apply 1 application topically 2 (two) times daily. (Patient not taking: Reported on 04/13/2023) 30 g 1   No current facility-administered medications on file prior to visit.    BP (!) 144/82   Pulse 67   Temp (!) 97.2 F (36.2 C) (Temporal)   Ht 4' 10.5" (1.486 m)   Wt 113 lb (51.3 kg)   SpO2 97%   BMI 23.22 kg/m  Objective:   Physical Exam Cardiovascular:     Rate and Rhythm: Normal rate and regular rhythm.  Pulmonary:     Effort: Pulmonary effort is normal.     Breath sounds: Normal breath sounds.  Abdominal:     General: Bowel sounds are normal.     Palpations: Abdomen is soft.     Tenderness: There is no abdominal tenderness.  Musculoskeletal:     Cervical back: Neck supple.  Skin:    General: Skin is warm and dry.  Neurological:     Mental Status: She is alert and oriented to person, place, and time.  Psychiatric:        Mood and Affect: Mood normal.           Assessment & Plan:  Prediabetes Assessment & Plan: Repeat A1c pending.  Orders: -     Hemoglobin A1c  Screening mammogram for breast cancer -     3D Screening Mammogram, Left and Right; Future  Localized osteoporosis without current pathological fracture Assessment & Plan: Reviewed bone density scan result from March 2018 with patient. Recommended repeat bone density scan, she declines.  Continue calcium and vitamin D and weightbearing exercise.   Hyperlipidemia, unspecified  hyperlipidemia type Assessment & Plan: Repeat lipid panel pending.  Continue healthy diet and regular exercise. Continue aspirin 81 mg daily.  Orders: -     Lipid panel -     Comprehensive metabolic panel        Doreene Nest, NP

## 2023-04-13 NOTE — Patient Instructions (Signed)
 [facility].   Stop by the lab prior to leaving today. I will notify you of your results once received.   It was a pleasure to see you today!

## 2023-08-03 DIAGNOSIS — Z08 Encounter for follow-up examination after completed treatment for malignant neoplasm: Secondary | ICD-10-CM | POA: Diagnosis not present

## 2023-08-03 DIAGNOSIS — D225 Melanocytic nevi of trunk: Secondary | ICD-10-CM | POA: Diagnosis not present

## 2023-08-03 DIAGNOSIS — Z85828 Personal history of other malignant neoplasm of skin: Secondary | ICD-10-CM | POA: Diagnosis not present

## 2023-08-03 DIAGNOSIS — L814 Other melanin hyperpigmentation: Secondary | ICD-10-CM | POA: Diagnosis not present

## 2023-08-03 DIAGNOSIS — D0461 Carcinoma in situ of skin of right upper limb, including shoulder: Secondary | ICD-10-CM | POA: Diagnosis not present

## 2023-08-03 DIAGNOSIS — Z85820 Personal history of malignant melanoma of skin: Secondary | ICD-10-CM | POA: Diagnosis not present

## 2023-08-03 DIAGNOSIS — D492 Neoplasm of unspecified behavior of bone, soft tissue, and skin: Secondary | ICD-10-CM | POA: Diagnosis not present

## 2023-08-03 DIAGNOSIS — L821 Other seborrheic keratosis: Secondary | ICD-10-CM | POA: Diagnosis not present

## 2023-08-11 ENCOUNTER — Ambulatory Visit: Admitting: Nurse Practitioner

## 2023-08-11 ENCOUNTER — Ambulatory Visit: Payer: Self-pay | Admitting: Primary Care

## 2023-08-11 NOTE — Telephone Encounter (Signed)
 Called patient scheduled for visit Monday if symptoms change or any new symptoms she will go to walk in over the weekend. She declined another office.

## 2023-08-11 NOTE — Telephone Encounter (Signed)
  Chief Complaint: shingles rash Symptoms: itchy, painful Frequency: 2 days Pertinent Negatives: Patient denies fever, URI sx Disposition: [] ED /[x] Urgent Care (no appt availability in office) / [] Appointment(In office/virtual)/ []  [facility]/ [] Home Care/ [x] Refused Recommended Disposition /[] [place] [facility]/ []  Follow-up with PCP Additional Notes: Pt c/o shingles rash noticed yesterday. Reports it is itchy and painful and is spreading on lower abd. Denies any fevers, URI sx. Triager attempted to schedule with primary PC, but no access. Offered to schedule at alternate [facility], pt declined. Triager ultimately advised UC/virtual appt, but pt would like to see if provider from office would be able to accommodate to start antivirals ASAP. Triager will forward encounter for Florentina Addison, NP to review and advise. Patient verbalized understanding and is expecting call back from office for next steps. Triager also advised that if pt does not hear back from office, to follow disposition for further evaluation/treatment.    Copied from CRM [id number]. Topic: Clinical - Red Word Triage >> Aug 11, 2023 12:08 PM Chantha C wrote: Red Word that prompted transfer to Nurse Triage: Patient thinks she has shingles in lower abdomen, blisters, spreading, painful, hot, red, and itchy. Patient denies a fever. Patient wants to be treated as soon as possible. Please advise [phone]. Reason for Disposition  [1] Shingles rash (matches SYMPTOMS) AND [2] onset < 72 hours ago (3 days)  Answer Assessment - Initial Assessment Questions 1. APPEARANCE of RASH: "Describe the rash."      "Two very distinct stripes/blisters and another patch that is popping out on the lower abd" Itchy, burning 2. LOCATION: "Where is the rash located?"      Lower abdomen 3. ONSET: "When did the rash start?"      2 days ago - worsening  4. ITCHING: "Does the rash itch?" If Yes, ask: "How bad is the itch?"  (Scale 1-10; or mild,  moderate, severe)     moderate 5. PAIN: "Does the rash hurt?" If Yes, ask: "How bad is the pain?"  (Scale 0-10; or none, mild, moderate, severe)    - NONE (0): no pain    - MILD (1-3): doesn't interfere with normal activities     - MODERATE (4-7): interferes with normal activities or awakens from sleep     - SEVERE (8-10): excruciating pain, unable to do any normal activities     Severe, but not unbearable Has been taking ibuprofen 6. OTHER SYMPTOMS: "Do you have any other symptoms?" (e.g., fever)     Afebrile No other sx  Protocols used: Shingles (Zoster)-A-AH

## 2023-08-14 ENCOUNTER — Encounter: Payer: Self-pay | Admitting: General Practice

## 2023-08-14 ENCOUNTER — Ambulatory Visit (INDEPENDENT_AMBULATORY_CARE_PROVIDER_SITE_OTHER): Admitting: General Practice

## 2023-08-14 VITALS — BP 128/60 | HR 90 | Temp 97.0°F | Ht 58.5 in | Wt 109.0 lb

## 2023-08-14 DIAGNOSIS — B029 Zoster without complications: Secondary | ICD-10-CM | POA: Diagnosis not present

## 2023-08-14 NOTE — Patient Instructions (Signed)
 As discussed, please update me by Wednesday or Thursday of your symptoms.  Continue Tylenol as needed for pain.  It was a pleasure meeting you!

## 2023-08-14 NOTE — Assessment & Plan Note (Signed)
 Multiple red papules present on exam.  Hot to touch. Do not look infected. No other symptoms.  Past 72 hour mark for antiviral therapy.   Discussed treatment options with patient.  Patient declines rx for gabapentin. Would like to continue Tylenol. She will update if she changes her mind.   Discussed UC/ER precautions with patient.   Discussed shingles vaccine in the future and patient is agreeable to the second vaccine of shingrix.   She will update in two days if symptoms are worsening or better.

## 2023-08-14 NOTE — Progress Notes (Signed)
 Established Patient Office Visit  Subjective   Patient ID: Janet Burgess, female    DOB: 1941/03/11  Age: 83 y.o. MRN: [id number]  Chief Complaint  Patient presents with   Rash    On lower abdomen since last Thursday. Patient states area is blistered, itchy, and burns. Has been taking ibuprofen for the pain ever 4-5 hours.     Rash Pertinent negatives include no diarrhea, fever, shortness of breath or vomiting.    Janet Burgess is a 83 year old female, patient of Janet Rieger NP, with past medical history of osteoporosis, HLD, prediabetes, tobacco abuse, hx of malignant melanoma, presents today for an acute visit to discuss rash.   Rash: Patient called the office on 08/11/23 stating that she has shingles in lower abdomen, blisters, painful, hot, red and itchy. She reported that the rash was spreading. She was triaged by RN and she was offered appointment at a different clinic and recommended UC but patient refused recommended disposition and requested to wait until Monday to see a provider in our clinic.   Today she reports, her symptom onset was Thursday. Rash is located on right lower abdomen and mid pelvic region. She describes the rash as red and hot to touch. She does have some nerve pain. The rash has not spread anywhere else on the body. She denies any change in detergent, soap or lotion. She denies any medication changes. She has had one dose of Zoster, live in 2013 and one dose of Shingrix in 2018.   Patient Active Problem List   Diagnosis Date Noted   Herpes zoster without complication 08/14/2023   Tobacco abuse 07/21/2017   Personal history of malignant melanoma 08/24/2016   Osteoporosis 08/24/2016   Medicare annual wellness visit, subsequent 05/01/2015   Prediabetes 05/01/2015   Hyperlipidemia 01/13/2015   Past Medical History:  Diagnosis Date   Migraine    No Known Allergies       08/14/2023   12:21 PM 04/13/2023    8:55 AM 10/20/2021    9:30 AM   Depression screen PHQ 2/9  Decreased Interest 0 0 0  Down, Depressed, Hopeless 0 0 0  PHQ - 2 Score 0 0 0  Altered sleeping 0  0  Tired, decreased energy 0  0  Change in appetite 0  0  Feeling bad or failure about yourself  0  0  Trouble concentrating 0  0  Moving slowly or fidgety/restless 0  0  Suicidal thoughts 0  0  PHQ-9 Score 0  0  Difficult doing work/chores Not difficult at all  Not difficult at all       08/14/2023   12:21 PM  GAD 7 : Generalized Anxiety Score  Nervous, Anxious, on Edge 0  Control/stop worrying 0  Worry too much - different things 0  Trouble relaxing 0  Restless 0  Easily annoyed or irritable 0  Afraid - awful might happen 0  Total GAD 7 Score 0  Anxiety Difficulty Not difficult at all      Review of Systems  Constitutional:  Negative for chills and fever.  Respiratory:  Negative for shortness of breath.   Cardiovascular:  Negative for chest pain.  Gastrointestinal:  Negative for abdominal pain, constipation, diarrhea, heartburn, nausea and vomiting.  Genitourinary:  Negative for dysuria, frequency and urgency.  Skin:  Positive for rash.  Neurological:  Negative for dizziness and headaches.  Endo/Heme/Allergies:  Negative for polydipsia.  Psychiatric/Behavioral:  Negative for depression and suicidal ideas.  The patient is not nervous/anxious.       Objective:     BP 128/60 (BP Location: Left Arm, Patient Position: Sitting, Cuff Size: Normal)   Pulse 90   Temp (!) 97 F (36.1 C) (Oral)   Ht 4' 10.5" (1.486 m)   Wt 109 lb (49.4 kg)   SpO2 99%   BMI 22.39 kg/m  BP Readings from Last 3 Encounters:  08/14/23 128/60  04/13/23 (!) 144/82  10/20/21 108/68   Wt Readings from Last 3 Encounters:  08/14/23 109 lb (49.4 kg)  04/13/23 113 lb (51.3 kg)  10/20/21 110 lb (49.9 kg)      Physical Exam Vitals and nursing note reviewed.  Constitutional:      Appearance: Normal appearance.  Cardiovascular:     Rate and Rhythm: Normal rate  and regular rhythm.     Pulses: Normal pulses.     Heart sounds: Normal heart sounds.  Pulmonary:     Effort: Pulmonary effort is normal.     Breath sounds: Normal breath sounds.  Skin:    Findings: Erythema and rash present. Rash is papular.          Comments: Multiple red papules, hot to touch present on right lower abdomen and mid pelvic region.   Neurological:     Mental Status: She is alert and oriented to person, place, and time.  Psychiatric:        Mood and Affect: Mood normal.        Behavior: Behavior normal.        Thought Content: Thought content normal.        Judgment: Judgment normal.      No results found for any visits on 08/14/23.     The ASCVD Risk score (Arnett DK, et al., 2019) failed to calculate for the following reasons:   The 2019 ASCVD risk score is only valid for ages 55 to 40    Assessment & Plan:  Herpes zoster without complication Assessment & Plan: Multiple red papules present on exam.  Hot to touch. Do not look infected. No other symptoms.  Past 72 hour mark for antiviral therapy.   Discussed treatment options with patient.  Patient declines rx for gabapentin. Would like to continue Tylenol. She will update if she changes her mind.   Discussed UC/ER precautions with patient.   Discussed shingles vaccine in the future and patient is agreeable to the second vaccine of shingrix.   She will update in two days if symptoms are worsening or better.      Return for update me in two days.Janet Charon, NP

## 2023-08-16 ENCOUNTER — Encounter: Payer: Self-pay | Admitting: General Practice

## 2023-08-16 DIAGNOSIS — B029 Zoster without complications: Secondary | ICD-10-CM

## 2023-08-16 MED ORDER — VALACYCLOVIR HCL 1 G PO TABS: 1000.0000 mg | ORAL_TABLET | Freq: Three times a day (TID) | ORAL | 0 refills | Status: AC

## 2023-09-25 DIAGNOSIS — Z85828 Personal history of other malignant neoplasm of skin: Secondary | ICD-10-CM | POA: Diagnosis not present

## 2023-09-25 DIAGNOSIS — Z08 Encounter for follow-up examination after completed treatment for malignant neoplasm: Secondary | ICD-10-CM | POA: Diagnosis not present

## 2023-09-25 DIAGNOSIS — D0461 Carcinoma in situ of skin of right upper limb, including shoulder: Secondary | ICD-10-CM | POA: Diagnosis not present

## 2023-09-25 DIAGNOSIS — L249 Irritant contact dermatitis, unspecified cause: Secondary | ICD-10-CM | POA: Diagnosis not present

## 2024-03-26 DIAGNOSIS — L821 Other seborrheic keratosis: Secondary | ICD-10-CM | POA: Diagnosis not present

## 2024-03-26 DIAGNOSIS — Z85820 Personal history of malignant melanoma of skin: Secondary | ICD-10-CM | POA: Diagnosis not present

## 2024-03-26 DIAGNOSIS — L814 Other melanin hyperpigmentation: Secondary | ICD-10-CM | POA: Diagnosis not present

## 2024-03-26 DIAGNOSIS — Z85828 Personal history of other malignant neoplasm of skin: Secondary | ICD-10-CM | POA: Diagnosis not present

## 2024-03-26 DIAGNOSIS — L570 Actinic keratosis: Secondary | ICD-10-CM | POA: Diagnosis not present

## 2024-03-26 DIAGNOSIS — Z08 Encounter for follow-up examination after completed treatment for malignant neoplasm: Secondary | ICD-10-CM | POA: Diagnosis not present

## 2024-03-26 DIAGNOSIS — D225 Melanocytic nevi of trunk: Secondary | ICD-10-CM | POA: Diagnosis not present

## 2024-05-11 DIAGNOSIS — Z23 Encounter for immunization: Secondary | ICD-10-CM | POA: Diagnosis not present
